# Patient Record
Sex: Female | Born: 1960 | Race: White | Hispanic: No | Smoking: Never smoker
Health system: Southern US, Community
[De-identification: ages and names within clinical notes are randomized; demographics above are authoritative.]

## PROBLEM LIST (undated history)

## (undated) DIAGNOSIS — I2699 Other pulmonary embolism without acute cor pulmonale: Secondary | ICD-10-CM

## (undated) HISTORY — PX: ABDOMINAL HYSTERECTOMY: SUR658

## (undated) HISTORY — PX: TONSILLECTOMY: SUR1361

## (undated) HISTORY — PX: CHOLECYSTECTOMY: SHX55

## (undated) HISTORY — PX: REPLACEMENT TOTAL KNEE BILATERAL: SUR1225

---

## 2005-02-10 ENCOUNTER — Ambulatory Visit: Payer: Self-pay | Admitting: Family Medicine

## 2005-02-22 ENCOUNTER — Ambulatory Visit: Payer: Self-pay | Admitting: Family Medicine

## 2005-02-24 ENCOUNTER — Ambulatory Visit: Payer: Self-pay | Admitting: Cardiology

## 2005-03-08 ENCOUNTER — Ambulatory Visit: Payer: Self-pay | Admitting: Family Medicine

## 2005-08-18 ENCOUNTER — Ambulatory Visit: Payer: Self-pay

## 2005-09-29 ENCOUNTER — Inpatient Hospital Stay: Payer: Self-pay

## 2006-07-18 ENCOUNTER — Ambulatory Visit: Payer: Self-pay | Admitting: Otolaryngology

## 2007-03-13 ENCOUNTER — Ambulatory Visit: Payer: Self-pay | Admitting: Orthopedic Surgery

## 2010-01-25 ENCOUNTER — Ambulatory Visit: Payer: Self-pay | Admitting: Unknown Physician Specialty

## 2010-03-17 ENCOUNTER — Ambulatory Visit: Payer: Self-pay | Admitting: Internal Medicine

## 2013-03-05 ENCOUNTER — Emergency Department: Payer: Self-pay | Admitting: Emergency Medicine

## 2013-03-05 LAB — CBC WITH DIFFERENTIAL/PLATELET
Basophil #: 0 10*3/uL (ref 0.0–0.1)
Eosinophil %: 0.4 %
HGB: 14.6 g/dL (ref 12.0–16.0)
MCH: 30.2 pg (ref 26.0–34.0)
MCHC: 33.4 g/dL (ref 32.0–36.0)
Monocyte #: 0.5 x10 3/mm (ref 0.2–0.9)
Monocyte %: 12.3 %
Neutrophil #: 2.1 10*3/uL (ref 1.4–6.5)
WBC: 4.3 10*3/uL (ref 3.6–11.0)

## 2013-03-05 LAB — COMPREHENSIVE METABOLIC PANEL
Albumin: 3.9 g/dL (ref 3.4–5.0)
Alkaline Phosphatase: 77 U/L (ref 50–136)
Chloride: 108 mmol/L — ABNORMAL HIGH (ref 98–107)
EGFR (Non-African Amer.): >60
Glucose: 90 mg/dL (ref 65–99)
Osmolality: 279 (ref 275–301)
Potassium: 3.6 mmol/L (ref 3.5–5.1)
Sodium: 140 mmol/L (ref 136–145)

## 2013-04-10 ENCOUNTER — Ambulatory Visit: Payer: Self-pay | Admitting: Internal Medicine

## 2013-08-05 ENCOUNTER — Ambulatory Visit: Payer: Self-pay | Admitting: Gastroenterology

## 2014-01-29 ENCOUNTER — Ambulatory Visit: Payer: Self-pay | Admitting: Unknown Physician Specialty

## 2014-02-13 ENCOUNTER — Ambulatory Visit: Payer: Self-pay | Admitting: Surgery

## 2014-02-17 LAB — PATHOLOGY REPORT

## 2014-03-05 ENCOUNTER — Ambulatory Visit: Payer: Self-pay | Admitting: Nurse Practitioner

## 2014-09-08 ENCOUNTER — Encounter: Payer: Self-pay | Admitting: Internal Medicine

## 2014-09-10 ENCOUNTER — Inpatient Hospital Stay: Payer: Self-pay | Admitting: Internal Medicine

## 2014-09-10 ENCOUNTER — Ambulatory Visit: Payer: Self-pay | Admitting: Internal Medicine

## 2014-09-10 LAB — CBC WITH DIFFERENTIAL/PLATELET
BASOS ABS: 0 10*3/uL (ref 0.0–0.1)
Basophil %: 0.6 %
Eosinophil #: 0.4 10*3/uL (ref 0.0–0.7)
Eosinophil %: 5 %
HCT: 38.7 % (ref 35.0–47.0)
HGB: 12.7 g/dL (ref 12.0–16.0)
LYMPHS ABS: 1.7 10*3/uL (ref 1.0–3.6)
LYMPHS PCT: 24 %
MCH: 30.3 pg (ref 26.0–34.0)
MCHC: 32.8 g/dL (ref 32.0–36.0)
MCV: 92 fL (ref 80–100)
MONOS PCT: 6.2 %
Monocyte #: 0.4 x10 3/mm (ref 0.2–0.9)
NEUTROS PCT: 64.2 %
Neutrophil #: 4.6 10*3/uL (ref 1.4–6.5)
PLATELETS: 267 10*3/uL (ref 150–440)
RBC: 4.19 10*6/uL (ref 3.80–5.20)
RDW: 13.3 % (ref 11.5–14.5)
WBC: 7.2 10*3/uL (ref 3.6–11.0)

## 2014-09-10 LAB — BASIC METABOLIC PANEL
Anion Gap: 10 (ref 7–16)
BUN: 14 mg/dL (ref 7–18)
CHLORIDE: 104 mmol/L (ref 98–107)
CO2: 26 mmol/L (ref 21–32)
CREATININE: 0.84 mg/dL (ref 0.60–1.30)
Calcium, Total: 8.5 mg/dL (ref 8.5–10.1)
GLUCOSE: 106 mg/dL — AB (ref 65–99)
OSMOLALITY: 280 (ref 275–301)
POTASSIUM: 3.8 mmol/L (ref 3.5–5.1)
SODIUM: 140 mmol/L (ref 136–145)

## 2014-09-10 LAB — TROPONIN I

## 2014-09-14 ENCOUNTER — Encounter: Payer: Self-pay | Admitting: Internal Medicine

## 2014-09-14 LAB — CBC WITH DIFFERENTIAL/PLATELET
Basophil #: 0.1 10*3/uL (ref 0.0–0.1)
Basophil %: 0.7 %
EOS PCT: 5.3 %
Eosinophil #: 0.5 10*3/uL (ref 0.0–0.7)
HCT: 41.7 % (ref 35.0–47.0)
HGB: 13.3 g/dL (ref 12.0–16.0)
LYMPHS ABS: 2.9 10*3/uL (ref 1.0–3.6)
Lymphocyte %: 32.1 %
MCH: 29.7 pg (ref 26.0–34.0)
MCHC: 31.9 g/dL — AB (ref 32.0–36.0)
MCV: 93 fL (ref 80–100)
MONOS PCT: 6.1 %
Monocyte #: 0.5 x10 3/mm (ref 0.2–0.9)
NEUTROS ABS: 5 10*3/uL (ref 1.4–6.5)
NEUTROS PCT: 55.8 %
PLATELETS: 322 10*3/uL (ref 150–440)
RBC: 4.48 10*6/uL (ref 3.80–5.20)
RDW: 13.5 % (ref 11.5–14.5)
WBC: 9 10*3/uL (ref 3.6–11.0)

## 2014-09-14 LAB — BASIC METABOLIC PANEL
ANION GAP: 7 (ref 7–16)
BUN: 19 mg/dL — ABNORMAL HIGH (ref 7–18)
CHLORIDE: 102 mmol/L (ref 98–107)
Calcium, Total: 8.9 mg/dL (ref 8.5–10.1)
Co2: 29 mmol/L (ref 21–32)
Creatinine: 0.98 mg/dL (ref 0.60–1.30)
EGFR (African American): >60
EGFR (Non-African Amer.): >60
GLUCOSE: 103 mg/dL — AB (ref 65–99)
OSMOLALITY: 278 (ref 275–301)
Potassium: 3.9 mmol/L (ref 3.5–5.1)
SODIUM: 138 mmol/L (ref 136–145)

## 2014-09-25 ENCOUNTER — Emergency Department: Payer: Self-pay | Admitting: Emergency Medicine

## 2014-09-25 LAB — CBC WITH DIFFERENTIAL/PLATELET
BASOS ABS: 0.1 10*3/uL (ref 0.0–0.1)
BASOS PCT: 0.8 %
Eosinophil #: 0.2 10*3/uL (ref 0.0–0.7)
Eosinophil %: 2.7 %
HCT: 42.2 % (ref 35.0–47.0)
HGB: 13.5 g/dL (ref 12.0–16.0)
Lymphocyte #: 2.6 10*3/uL (ref 1.0–3.6)
Lymphocyte %: 28.7 %
MCH: 29.3 pg (ref 26.0–34.0)
MCHC: 31.9 g/dL — AB (ref 32.0–36.0)
MCV: 92 fL (ref 80–100)
MONO ABS: 0.6 x10 3/mm (ref 0.2–0.9)
Monocyte %: 6.6 %
NEUTROS PCT: 61.2 %
Neutrophil #: 5.5 10*3/uL (ref 1.4–6.5)
PLATELETS: 375 10*3/uL (ref 150–440)
RBC: 4.6 10*6/uL (ref 3.80–5.20)
RDW: 13.2 % (ref 11.5–14.5)
WBC: 8.9 10*3/uL (ref 3.6–11.0)

## 2014-09-25 LAB — URINALYSIS, COMPLETE
Bilirubin,UR: NEGATIVE
Blood: NEGATIVE
Glucose,UR: NEGATIVE mg/dL (ref 0–75)
KETONE: NEGATIVE
Leukocyte Esterase: NEGATIVE
Nitrite: NEGATIVE
PROTEIN: NEGATIVE
Ph: 5 (ref 4.5–8.0)
RBC,UR: <1 /HPF (ref 0–5)
SPECIFIC GRAVITY: 1.006 (ref 1.003–1.030)
Squamous Epithelial: <1
WBC UR: <1 /HPF (ref 0–5)

## 2014-09-25 LAB — COMPREHENSIVE METABOLIC PANEL
ALT: 31 U/L
Albumin: 3.8 g/dL (ref 3.4–5.0)
Alkaline Phosphatase: 118 U/L — ABNORMAL HIGH
Anion Gap: 10 (ref 7–16)
BUN: 15 mg/dL (ref 7–18)
Bilirubin,Total: 0.5 mg/dL (ref 0.2–1.0)
CHLORIDE: 106 mmol/L (ref 98–107)
CO2: 26 mmol/L (ref 21–32)
Calcium, Total: 9.4 mg/dL (ref 8.5–10.1)
Creatinine: 1.11 mg/dL (ref 0.60–1.30)
EGFR (African American): >60
EGFR (Non-African Amer.): 54 — ABNORMAL LOW
Glucose: 90 mg/dL (ref 65–99)
Osmolality: 283 (ref 275–301)
Potassium: 4.1 mmol/L (ref 3.5–5.1)
SGOT(AST): 18 U/L (ref 15–37)
Sodium: 142 mmol/L (ref 136–145)
Total Protein: 7.7 g/dL (ref 6.4–8.2)

## 2014-10-10 DIAGNOSIS — I2699 Other pulmonary embolism without acute cor pulmonale: Secondary | ICD-10-CM

## 2014-10-10 HISTORY — DX: Other pulmonary embolism without acute cor pulmonale: I26.99

## 2014-10-14 ENCOUNTER — Encounter: Payer: Self-pay | Admitting: Internal Medicine

## 2014-11-14 ENCOUNTER — Encounter: Payer: Self-pay | Admitting: Internal Medicine

## 2015-03-07 NOTE — Discharge Summary (Signed)
PATIENT NAME:  Lacey Bradley, Lacey Bradley MR#:  [id number] DATE OF BIRTH:  01-Jun-1961  DATE OF ADMISSION:  09/10/2014 DATE OF DISCHARGE:    DISCHARGING PHYSICIAN: Sheikh A. Elijio Miles, MD.  ADMITTING PHYSICIAN: Srikar R. Sudini, MD.  DISCHARGE DIAGNOSES: 1. Acute bilateral pulmonary embolism.  2. Recent bilateral knee surgery.  3. Morbid obesity.  4. Chronic pain syndrome.   DIAGNOSTIC IMAGING: 1. CT scan of the chest revealed multiple bilateral pulmonary emboli, most prominent in the right middle and lower lobes.  2. Ultrasound of lower extremities. No evidence of deep venous thrombosis.  3. Chest x-ray on 09/13/2014: Mild cardiomegaly. No active lung disease.   HOSPITAL COURSE: This lady was admitted through the Emergency Room where she presented with shortness of breath, chest pain, lower extremity swelling following bilateral knee replacement a week previously. CT scan, angiography in the ED revealed bilateral pulmonary emboli. Please refer to the history and physical for details. The patient was admitted to a monitored bed. She underwent echocardiogram which did not reveal any evidence of RV strain. She was initially placed on intravenous heparin which was transitioned to oral Apixaban 10 mg twice a day. The patient continued to complain of chest pain without shortness of breath. However, a 12-lead EKG and chest x-ray were unremarkable. The patient remained on room air oxygen throughout her hospitalization. The patient'Lacey Bradley pain was managed with OxyContin and Dilaudid, and she is being discharged to a skilled nursing facility in satisfactory condition.   DISCHARGE MEDICATIONS: 1. Crestor 10 mg at bedtime.  2. Acetaminophen 325 mg 2 tabs every 4 hours as needed for pain. 3. Morphine 5000 mcg oral tablet daily. 4. Co-enzyme Q-10  at 200 mg 2 caps daily.  5. Desipramine 25 mg at bedtime.  6. Docusate sodium 100 mg b.i.d. 7. Esomeprazole 40 mg b.i.d.  8. Klor-Con 20 mEq 1/2 pack daily.  9. Milk of  Magnesia 8% oral suspension 30 mL daily p.r.n. for constipation. 10. Furosemide 30 mg daily.  11. Zonisamide 100 mg 2 capsules at bedtime. 12. OxyContin 20 mg every 12 hours. 13. Methocarbamol 500 mg every 6 hours p.r.n. for pain. 14. Dilaudid 2 mg 1 to 2 tabs every 3 hours p.r.n.  15. Esomeprazole 40 mg b.i.d.  16. Apixaban 5 mg 2 tabs daily until 11/5 then 5 mg b.i.d. daily for 6 months. 17. Aspirin 325 mg b.i.d. for 10 days only,   DIET: Low sodium.   ACTIVITY: As tolerated.  FOLLOWUP: In 2 to 4 weeks of discharge from skilled facility with Lacey Knudsen, NP.    DISCHARGE PROCESS TIME SPENT: 35 minutes.   ____________________________ Lacey Maxon Elijio Miles, MD sat:jh D: 09/15/2014 13:50:45 ET T: 09/15/2014 15:12:53 ET JOB#: [id number]  cc: Sheikh A. Elijio Miles, MD, <Dictator> Lacey Fells MD ELECTRONICALLY SIGNED 09/25/2014 10:01

## 2015-03-07 NOTE — Op Note (Signed)
PATIENT NAME:  Lacey Bradley, Lacey Bradley MR#:  [id number] DATE OF BIRTH:  Oct 28, 1961  DATE OF PROCEDURE:  02/13/2014  PREOPERATIVE DIAGNOSIS: Chronic cholecystitis.   POSTOPERATIVE DIAGNOSIS:  Chronic cholecystitis.  PROCEDURE: Laparoscopic cholecystectomy.   SURGEON: Rochel Brome, M.D.   ANESTHESIA: General.   INDICATIONS: This 54 year old female has history of severe right upper quadrant abdominal pain and borderline low ejection fraction of 39%, which, with injection of CCK, reproduced her right upper quadrant pain. Surgery was recommended for definitive treatment.   The patient was placed on the operating table in the supine position under general anesthesia. The abdomen was prepared with ChloraPrep, draped in a sterile manner.   A short incision was made in the inferior aspect of the umbilicus and carried down to the deep fascia, which was grasped with laryngeal hook and elevated. A Veress needle was inserted, aspirated and irrigated with a saline solution. Next, the peritoneal cavity was inflated with carbon dioxide. The Veress needle was removed. The 10 mm cannula was inserted. The 10 mm, 0 degree laparoscope was inserted to view the peritoneal cavity. The liver appeared to have some mild fatty infiltration. Another incision was made in the epigastrium slightly to the right of the midline to introduce an 11 mm cannula. Two incisions were made in the lateral aspect of the right upper quadrant to introduce two 5 mm cannulas.   There was some fatty infiltration of the liver noted. The patient was placed in the reverse Trendelenburg position and turned several degrees to the left. The gallbladder was retracted towards the right shoulder. Multiple adhesions were taken down with blunt and sharp dissection. The neck of the gallbladder was then retracted anteriorly and laterally. The cystic duct was dissected free from surrounding structures. The gallbladder neck was mobilized with incision of the visceral  peritoneum. The cystic artery was dissected free from surrounding structures. A critical view of safety was demonstrated. An endoclip was placed across the cystic duct. An incision was made in the cystic duct. The duct was found to be very small and Reddick catheter would not thread in due to its small size; therefore, a cholangiogram was not done. The Reddick catheter was removed. The cystic duct was doubly ligated with endoclips and divided. The cystic artery was controlled with double endoclips and divided. The gallbladder was dissected free from the liver with hook and cautery. Bleeding was minimal. Hemostasis subsequently intact. The gallbladder was delivered up through the infraumbilical incision, opened and suctioned, removed and submitted for routine pathology. The right upper quadrant was further inspected. Hemostasis was intact. The cannulas were removed allowing carbon dioxide to escape from the peritoneal cavity. The skin incisions were closed with interrupted 5-0 chromic subcuticular suture, benzoin  and Steri-Strips. Dressings were applied with paper tape. The patient tolerated the surgery satisfactorily. He was prepared for transfer to the recovery room.     ____________________________ Lenna Sciara. Rochel Brome, MD jws:dmm D: 02/13/2014 14:59:35 ET T: 02/13/2014 19:06:44 ET JOB#: [id number]  cc: Loreli Dollar, MD, <Dictator> Loreli Dollar MD ELECTRONICALLY SIGNED 02/25/2014 8:48

## 2015-03-07 NOTE — H&P (Signed)
PATIENT NAME:  Lacey Bradley, Lacey Bradley MR#:  [id number] DATE OF BIRTH:  02/19/1961  DATE OF ADMISSION:  09/10/2014  PRIMARY CARE PROVIDER: Chelsa B. Matthew Saras, NP and Perrin Maltese, MD.    CHIEF COMPLAINT: Shortness of breath, as well as chest pain, lower extremity swelling.   HISTORY OF PRESENT ILLNESS: A 54 year old Caucasian female patient with morbid obesity, arthritis, migraines, recently had bilateral knee replacements and presently in a nursing home, had a CT scan of the chest done for chest pain and shortness of breath. The patient has been found to have bilateral pulmonary emboli with RV strain on the CT, and is being admitted to the hospitalist service. The patient does have shortness of breath, but her saturations are normal. Heart rate and blood pressure are in the normal range. She did receive a dose of Lovenox yesterday night therapeutic 130 mg for concern for PE.   The patient does not have any history of PE or DVT. She mentions that she has been on SCDs, also aspirin 325 mg 2 times a day has been started for DVT prophylaxis by her orthopedic surgeon.   PAST MEDICAL HISTORY: 1.  Obesity.  2.  Migraines.  3.  Asthma.  4.  Arthritis.  5.  Hysterectomy.  6.  Bilateral total knee replacement.   ALLERGIES: MORPHINE, TOPAMAX TOPICAL CORTICAL STEROIDS.   FAMILY HISTORY: Both ovarian and uterine cancer. No history of DVT, PE.   HOME MEDICATIONS: 1.  Acetaminophen 325, two tablets every 4 hours as needed for pain.  2.  Aspirin 325 mg oral 2 times a day.  3.  Biotin 5000 mcg oral daily.  4.  Coenzyme Q10 at 200 mg 2 capsules daily.  5.  Crestor 10 mg daily.  6.  Desipramine 25 mg 2 tablets daily.  7.  Docusate sodium 100 mg oral 2 times a day as needed for constipation.  8.   Protonix 40 mg oral 2 times a day.  9.  Hydromorphone 2 mg 1 to 3 tablets every 3 hours as needed for pain.  10.   Potassium chloride 20 mEq  oral once a day.  11.   Methocarbamol 500 mg oral every 6 hours as  needed for pain.  12.   Milk of magnesia 8% oral, 30 mL once a day as needed for constipation.  13.   OxyContin 20 mg oral every 12 hours.  14.   Torsemide 10 mg oral once a day.  15.   Zonisamide 100 mg 2 capsules oral once a day.   REVIEW OF SYSTEMS: CONSTITUTIONAL: Complains of fatigue, weakness, weight gain.  ENT: No blurred vision, pain or redness.  HEENT: No tinnitus, ear pain, hearing loss.  RESPIRATORY: Has shortness of breath, asthma.  CARDIOVASCULAR: Has right-sided chest pain, orthopnea, and lower extremity edema.  GASTROINTESTINAL: No nausea, vomiting, diarrhea, abdominal pain.  GENITOURINARY: No dysuria, hematuria, frequency.  ENDOCRINE: No polyuria, nocturia, thyroid problems.  HEMATOLOGIC AND LYMPHATIC: No anemia, easy bruising, bleeding.  INTEGUMENTARY: No acne, rash, lesion.  MUSCULOSKELETAL: Has bilateral knee pain after surgery.  NEUROLOGIC: No focal numbness, weakness, seizure.  PSYCHIATRIC: No anxiety or depression.   PHYSICAL EXAMINATION: VITAL SIGNS: Temperature 98.2, pulse of 80, blood pressure 157/69, saturating 99% on room air.  GENERAL: A morbidly obese Caucasian female patient lying in bed, overall seems comfortable, conversational, cooperative with exam.  PSYCHIATRIC: Alert and oriented x 3. Mood and affect appropriate. Judgment intact.  HEENT: Atraumatic, normocephalic. Oral mucosa moist and pink. External ears and  nose normal. No pallor. No icterus. Pupils bilaterally equal and reactive to light.  NECK: Supple. No thyromegaly. No palpable lymph nodes. Trachea midline. No carotid bruit, JVD.  CARDIOVASCULAR: S1, S2, without any murmurs. Peripheral pulses 2+. No edema.  RESPIRATORY: Normal work of breathing. Clear to auscultation on both sides.  GASTROINTESTINAL: Soft abdomen, nontender. Bowel sounds present. No organomegaly palpable.  SKIN: Warm and dry. No petechiae, rash, ulcers.  MUSCULOSKELETAL: Has scars from recent bilateral knee surgeries, along  with swelling. No discharge. They are mildly warm, but no redness. No other joint swelling or redness noticed. Has some edema in bilateral lower extremities.  NEUROLOGICAL: Motor strength 5/5 in upper and lower extremities. Sensation is intact all over.  LYMPHATIC: No cervical lymphadenopathy.   LABORATORY STUDIES: Glucose 106, BUN 14, creatinine 0.84. Sodium 140, potassium 3.8. Troponin less than 0.02.    hemoglobin 12.7, platelets of 267,000.   EKG shows normal sinus rhythm.   A CTA of the chest shows multiple bilateral pulmonary emboli with evidence of right heart strain with abnormal RV LV ratio. No infiltrate or pulmonary edema.   ASSESSMENT AND PLAN: 1.  Bilateral pulmonary emboli with right ventricular strain on the CT scan. Presently, the patient's vitals are stable. No tachycardia. Normal blood pressure. Her saturations are 98% on room air. I do not think she needs TPA at this point. Will admit patient due to her right ventricular strain on CT scan. Start her on Lovenox b.i.d. Get lower extremity Dopplers, check an echocardiogram. Hopefully, patient can be discharged on oral anticoagulation when better. This is a provoked DVT and first episode.  2.  Elevated blood pressure, likely secondary from anxiety and pain. If consistently elevated, will need blood pressure medications.  3.  Recent bilateral total knee arthroplasty. Continue PT. The patient will return to skilled nursing facility at discharge.  4.  History of migraines. Continue medications.   CODE STATUS: Full code.   TIME SPENT ON THIS CASE: 50 minutes.      ____________________________ Leia Alf Alyene Predmore, MD srs:at D: 09/10/2014 19:08:35 ET T: 09/10/2014 19:29:23 ET JOB#: [id number]  cc: Alveta Heimlich R. Darvin Neighbours, MD, <Dictator> Perrin Maltese, MD [place] Matthew Saras, NP Wagner, Gilliam MD ELECTRONICALLY SIGNED 09/18/2014 14:50

## 2016-10-31 ENCOUNTER — Ambulatory Visit (INDEPENDENT_AMBULATORY_CARE_PROVIDER_SITE_OTHER): Payer: BLUE CROSS/BLUE SHIELD

## 2016-10-31 ENCOUNTER — Ambulatory Visit
Admit: 2016-10-31 | Discharge: 2016-10-31 | Disposition: A | Discharge status: Home / Self Care | Payer: BLUE CROSS/BLUE SHIELD | Attending: Family Medicine | Admitting: Family Medicine

## 2016-10-31 ENCOUNTER — Ambulatory Visit
Admission: EM | Admit: 2016-10-31 | Discharge: 2016-10-31 | Disposition: A | Discharge status: Home / Self Care | Payer: BLUE CROSS/BLUE SHIELD | Attending: Family Medicine | Admitting: Family Medicine

## 2016-10-31 DIAGNOSIS — Z86711 Personal history of pulmonary embolism: Secondary | ICD-10-CM | POA: Insufficient documentation

## 2016-10-31 DIAGNOSIS — Z9889 Other specified postprocedural states: Secondary | ICD-10-CM | POA: Insufficient documentation

## 2016-10-31 DIAGNOSIS — M25551 Pain in right hip: Secondary | ICD-10-CM | POA: Diagnosis not present

## 2016-10-31 DIAGNOSIS — Z90710 Acquired absence of both cervix and uterus: Secondary | ICD-10-CM | POA: Insufficient documentation

## 2016-10-31 DIAGNOSIS — M545 Low back pain: Secondary | ICD-10-CM | POA: Insufficient documentation

## 2016-10-31 DIAGNOSIS — Z9049 Acquired absence of other specified parts of digestive tract: Secondary | ICD-10-CM | POA: Diagnosis not present

## 2016-10-31 DIAGNOSIS — Z96653 Presence of artificial knee joint, bilateral: Secondary | ICD-10-CM | POA: Diagnosis not present

## 2016-10-31 HISTORY — DX: Other pulmonary embolism without acute cor pulmonale: I26.99

## 2016-10-31 LAB — URINALYSIS, COMPLETE (UACMP) WITH MICROSCOPIC
BACTERIA UA: NONE SEEN
Bilirubin Urine: NEGATIVE
Glucose, UA: NEGATIVE mg/dL
HGB URINE DIPSTICK: NEGATIVE
Ketones, ur: NEGATIVE mg/dL
Leukocytes, UA: NEGATIVE
Nitrite: NEGATIVE
Protein, ur: NEGATIVE mg/dL
RBC / HPF: NONE SEEN RBC/hpf (ref 0–5)
SPECIFIC GRAVITY, URINE: 1.02 (ref 1.005–1.030)
pH: 6 (ref 5.0–8.0)

## 2016-10-31 NOTE — ED Triage Notes (Signed)
Patient complains of right hip pain. Patient states that she was recently taken off her blood thinner (xarelto) since she is having foot surgery tomorrow. Patient states that she has had no known injury to her hip and she states that the pain was so bad last night that it kept her up. Patient states that she is worried that this could be a blood clot causing this.

## 2016-10-31 NOTE — ED Provider Notes (Signed)
[facility] ____________________________________________  Time seen: Approximately 1500 PM  I have reviewed the triage vital signs and the nursing notes.   HISTORY  Chief Complaint Hip Pain (Right)  HPI Lacey Bradley is a 55 y.o. female with a history of bilateral pulmonary embolism post bilateral knee replacement in 2015, presenting for the complaint of right hip pain. Patient reports right hip pain started last night. Denies fall, injury, trauma or known trigger. Patient reports that she has generalized inflammatory pain and currently awaited to be evaluated by a rheumatologist. Patient states that she frequently has joint pain. Patient denies history of hip pain. States pain is directly to her right hip, but reports as she's been sitting in the urgent care waiting room she started having pain in her right lower back. Reports has continued to remain ambulatory. States pain is moderate. States pain is worse with movement. Improves with rest. States some pain to right posterior thigh.   Denies any lower extremity swelling, ecchymosis, paresthesias, recent sickness, recent travel or recent sedentary lifestyle. Patient expresses concern of a blood clot to her right leg. Patient states she is concerned of blood clots as she stopped her Xarelto 2 days ago in preparation to right foot surgery. States scheduled for right foot surgery tomorrow. Patient states that she came in today because she wanted to make sure she did not have a blood clot in her right leg. Patient reports feeling well otherwise.  Denies chest pain or shortness of breath, abdominal pain, dysuria, extremity swelling, rash, paresthesias, fever, recent sickness. Patient reports that she had been off of Xarelto for approximately 1 year, then restarted 5 months ago because her doctor said her d-dimer was starting to elevate and then improved on the Xarelto.  No LMP recorded. Patient has had a hysterectomy.    Past  Medical History:  Diagnosis Date  . Bilateral pulmonary embolism ([place]) 10/10/2014    There are no active problems to display for this patient.   Past Surgical History:  Procedure Laterality Date  . ABDOMINAL HYSTERECTOMY    . CHOLECYSTECTOMY    . REPLACEMENT TOTAL KNEE BILATERAL    . TONSILLECTOMY      Current Outpatient Rx  . Order #: [id number] Class: Historical Med  . Order #: [id number] Class: Historical Med  . Order #: [id number] Class: Historical Med  . Order #: [id number] Class: Historical Med  . Order #: [id number] Class: Historical Med    No current facility-administered medications for this encounter.   Current Outpatient Prescriptions:  .  esomeprazole (NEXIUM) 40 MG capsule, Take 40 mg by mouth daily at 12 noon., Disp: , Rfl:  .  nortriptyline (PAMELOR) 25 MG capsule, Take 25 mg by mouth at bedtime., Disp: , Rfl:  .  rivaroxaban (XARELTO) 10 MG TABS tablet, Take 10 mg by mouth daily., Disp: , Rfl:  .  rosuvastatin (CRESTOR) 10 MG tablet, Take 10 mg by mouth daily., Disp: , Rfl:  .  zonisamide (ZONEGRAN) 100 MG capsule, Take 100 mg by mouth daily., Disp: , Rfl:   Allergies Patient has no known allergies.  History reviewed. No pertinent family history.  Social History Social History  Substance Use Topics  . Smoking status: Never Smoker  . Smokeless tobacco: Never Used  . Alcohol use No    Review of Systems Constitutional: No fever/chills Eyes: No visual changes. ENT: No sore throat. Cardiovascular: Denies chest pain. Respiratory: Denies shortness of breath. Gastrointestinal: No abdominal pain.  No nausea, no vomiting.  No diarrhea.  No  constipation. Genitourinary: Negative for dysuria. Musculoskeletal: Negative for back pain.As above. Skin: Negative for rash. Neurological: Negative for headaches, focal weakness or numbness.  10-point ROS otherwise negative.  ____________________________________________   PHYSICAL EXAM:  VITAL SIGNS: ED Triage Vitals    Enc Vitals Group     BP 10/31/16 1243 (!) 149/75     Pulse Rate 10/31/16 1243 73     Resp 10/31/16 1243 17     Temp 10/31/16 1243 97.4 F (36.3 C)     Temp Source 10/31/16 1243 Oral     SpO2 10/31/16 1243 99 %     Weight 10/31/16 1235 297 lb (134.7 kg)     Height 10/31/16 1235 5\' 8"  (1.727 m)     Head Circumference --      Peak Flow --      Pain Score 10/31/16 1243 8     Pain Loc --      Pain Edu? --      Excl. in [place]? --     Constitutional: Alert and oriented. Well appearing and in no acute distress. Eyes: Conjunctivae are normal. PERRL. EOMI. ENT      Head: Normocephalic and atraumatic.      Nose: No congestion/rhinnorhea.      Mouth/Throat: Mucous membranes are moist. Cardiovascular: Normal rate, regular rhythm. Grossly normal heart sounds.  Good peripheral circulation. Respiratory: Normal respiratory effort without tachypnea nor retractions. Breath sounds are clear and equal bilaterally. No wheezes/rales/rhonchi.. Gastrointestinal: Soft and nontender.  Musculoskeletal: Ambulatory with mild antalgic gait. No midline cervical, thoracic or lumbar tenderness to palpation. Bilateral pedal pulses equal and easily palpated.      Right lower leg:  No tenderness or edema. except : Mild pain to right lateral hip and right piriformis muscular area, pain increases with right hip abduction, no pain with hip flexion, full range of motion present. Mild diffuse right posterior thigh tenderness to palpation, no swelling, no ecchymosis. Right lower extremity otherwise nontender. Negative Homan's sign. No midline tenderness.       Left lower leg:  No tenderness or edema.  Neurologic:  Normal speech and language.  Speech is normal. No gait instability.  Skin:  Skin is warm, dry and intact. No rash noted. Psychiatric: Mood and affect are normal. Speech and behavior are normal. Patient exhibits appropriate insight and judgment   ___________________________________________   LABS (all labs ordered  are listed, but only abnormal results are displayed)  Labs Reviewed  URINALYSIS, COMPLETE (UACMP) WITH MICROSCOPIC - Abnormal; Notable for the following:       Result Value   Squamous Epithelial / LPF 6-30 (*)    All other components within normal limits    RADIOLOGY  US Venous Img Lower Unilateral Right  Result Date: 10/31/2016 CLINICAL DATA:  Right hip and right leg pain for 1 day. EXAM: RIGHT LOWER EXTREMITY VENOUS DOPPLER ULTRASOUND TECHNIQUE: Gray-scale sonography with graded compression, as well as color Doppler and duplex ultrasound, were performed to evaluate the deep venous system from the level of the common femoral vein through the popliteal and proximal calf veins. Spectral Doppler was utilized to evaluate flow at rest and with distal augmentation maneuvers. COMPARISON:  None. FINDINGS: Left common femoral vein is patent without thrombus. Normal compressibility, augmentation and color Doppler flow in the right common femoral vein, right femoral vein and right popliteal vein. The right saphenofemoral junction is patent. Right profunda femoral vein is patent without thrombus. Visualized right deep calf veins are patent without thrombus. IMPRESSION: Negative  for deep venous thrombosis in right lower extremity. Electronically Signed   By: Markus Daft M.D.   On: 10/31/2016 16:20   Dg Hip Unilat With Pelvis 2-3 Views Right  Result Date: 10/31/2016 CLINICAL DATA:  55 year old female with pain in the right hip since yesterday evening. No history of trauma. EXAM: DG HIP (WITH OR WITHOUT PELVIS) 2-3V RIGHT COMPARISON:  None. FINDINGS: AP view of the pelvis demonstrates no acute displaced pelvic bone fractures. Bilateral proximal femurs as visualized appear intact and the right femoral head is located. Mild joint space narrowing, subchondral sclerosis and osteophyte formation is identified in both hip joints, compatible with mild osteoarthritis. Degenerative changes of osteoarthritis are also  noted at the symphysis pubis. IMPRESSION: 1. No acute abnormality of the bony pelvis or the right hip. 2. Mild bilateral hip joint osteoarthritis. Electronically Signed   By: Vinnie Langton M.D.   On: 10/31/2016 15:35   ____________________________________________   PROCEDURES Procedures   INITIAL IMPRESSION / ASSESSMENT AND PLAN / ED COURSE  Pertinent labs & imaging results that were available during my care of the patient were reviewed by me and considered in my medical decision making (see chart for details).  Well-appearing patient. No acute distress. Presents with complaints of right hip pain. Denies fall or trauma. Patient expresses concern of DVT has history of similar. Patient reports however it DVT in past was postsurgical. Patient denies recent surgery, but reports is scheduled for foot surgery tomorrow. Patient denies any other symptoms. Patient with right hip tenderness and piriformis muscular tenderness, suspect muscular and inflammatory pain. However as patient with history of DVT and recently off of her Xarelto for preop, will evaluate ultrasound. Also evaluate x-ray to exclude bony abnormality.  Right hip x-ray per radiologist no acute abnormality of the bony pelvis or the right hip, mild bilateral joint osteoarthritis. Per radiologist right lower extremity venous ultrasound negative for deep venous thrombosis. Discussed these results in detail with patient. Encouraged supportive care, rest, ice, elevation and follow-up closely with her primary care physician or orthopedist. Patient denies need for prescription pain medication. Discussed strict follow-up and return parameters.  Discussed follow up with Primary care physician this week. Discussed follow up and return parameters including no resolution or any worsening concerns. Patient verbalized understanding and agreed to plan.   ____________________________________________   FINAL CLINICAL IMPRESSION(S) / ED  DIAGNOSES  Final diagnoses:  Acute pain of right hip  Right hip pain     Discharge Medication List as of 10/31/2016  4:00 PM      Note: This dictation was prepared with Dragon dictation along with smaller phrase technology. Any transcriptional errors that result from this process are unintentional.    Clinical Course       Marylene Land, NP 10/31/16 2009

## 2016-10-31 NOTE — Discharge Instructions (Signed)
Rest. Ice.   Follow up closely with your primary care or orthopedic.   Return to Urgent care for new or worsening concerns.

## 2016-11-01 ENCOUNTER — Ambulatory Visit: Payer: BLUE CROSS/BLUE SHIELD

## 2017-01-06 IMAGING — CR DG HIP (WITH OR WITHOUT PELVIS) 2-3V*R*
3 series · 3 of 3 positions shown · non-contrast
Comparison: None.

CLINICAL DATA: 55-year-old female with pain in the right hip since
yesterday evening. No history of trauma.

EXAM:
DG HIP (WITH OR WITHOUT PELVIS) 2-3V RIGHT

[pelvis ap]
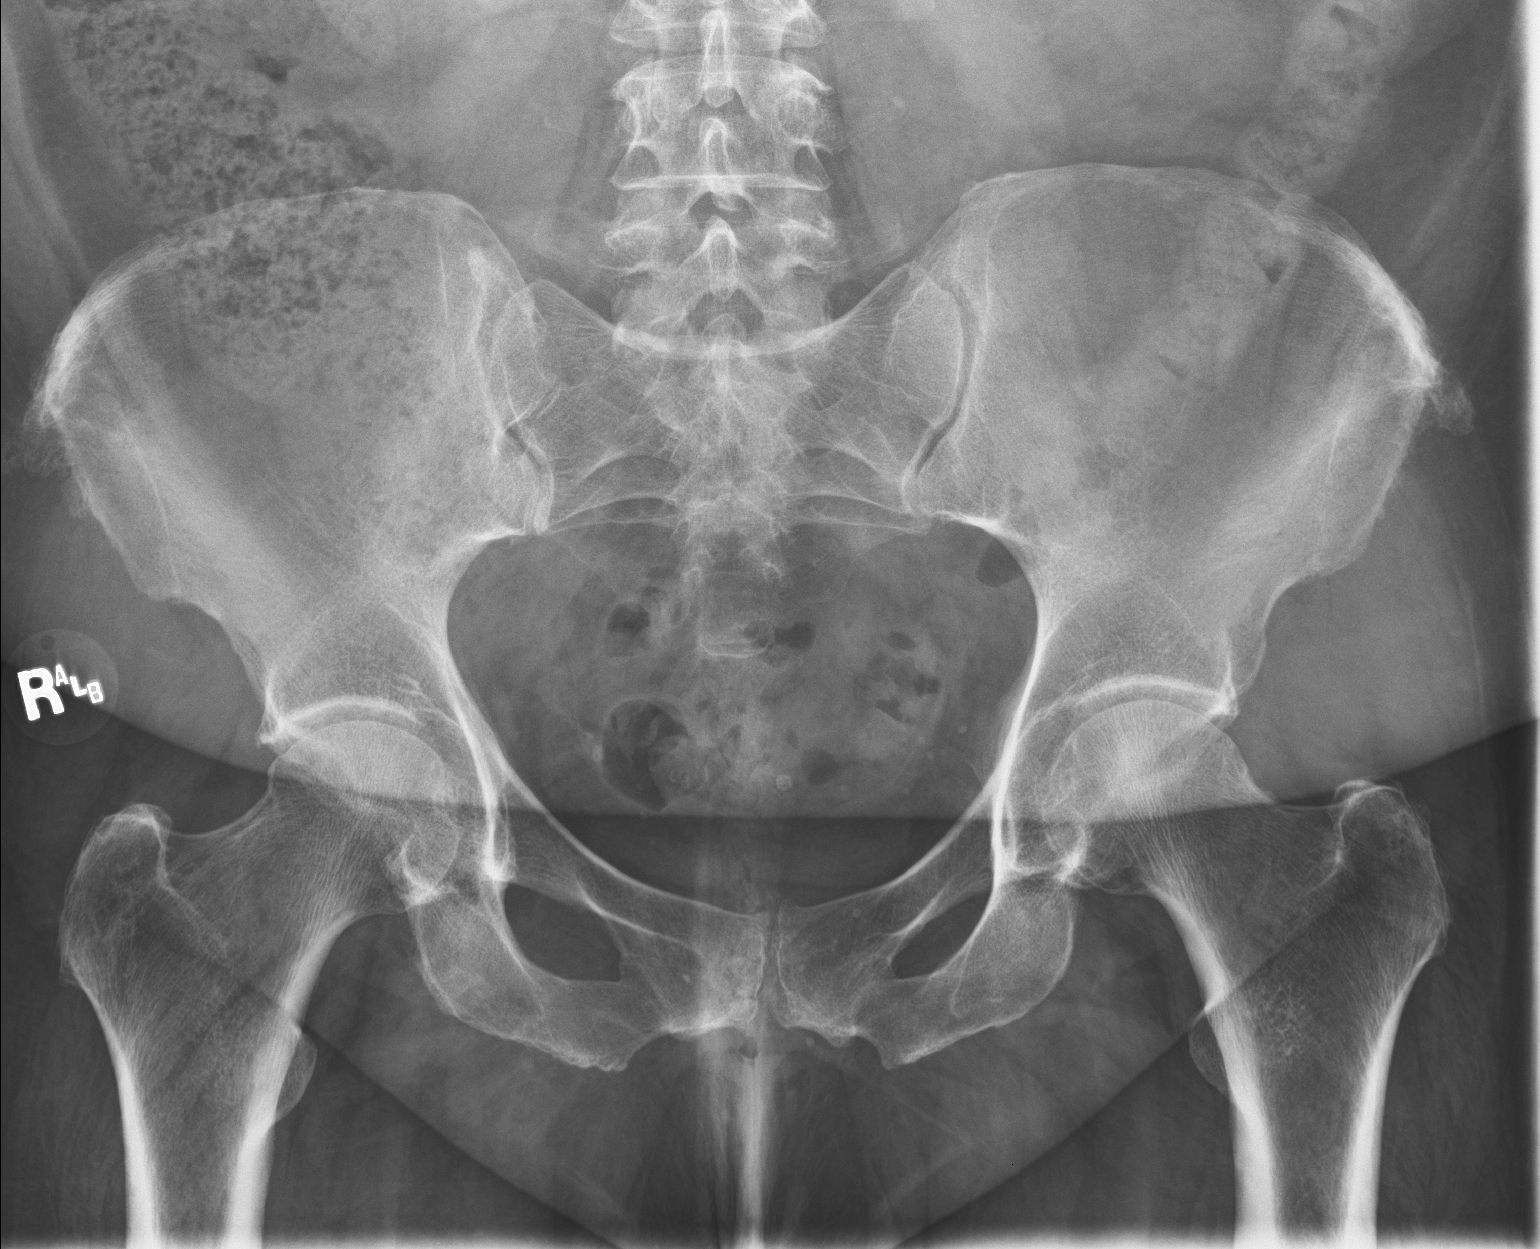

[hip ap]
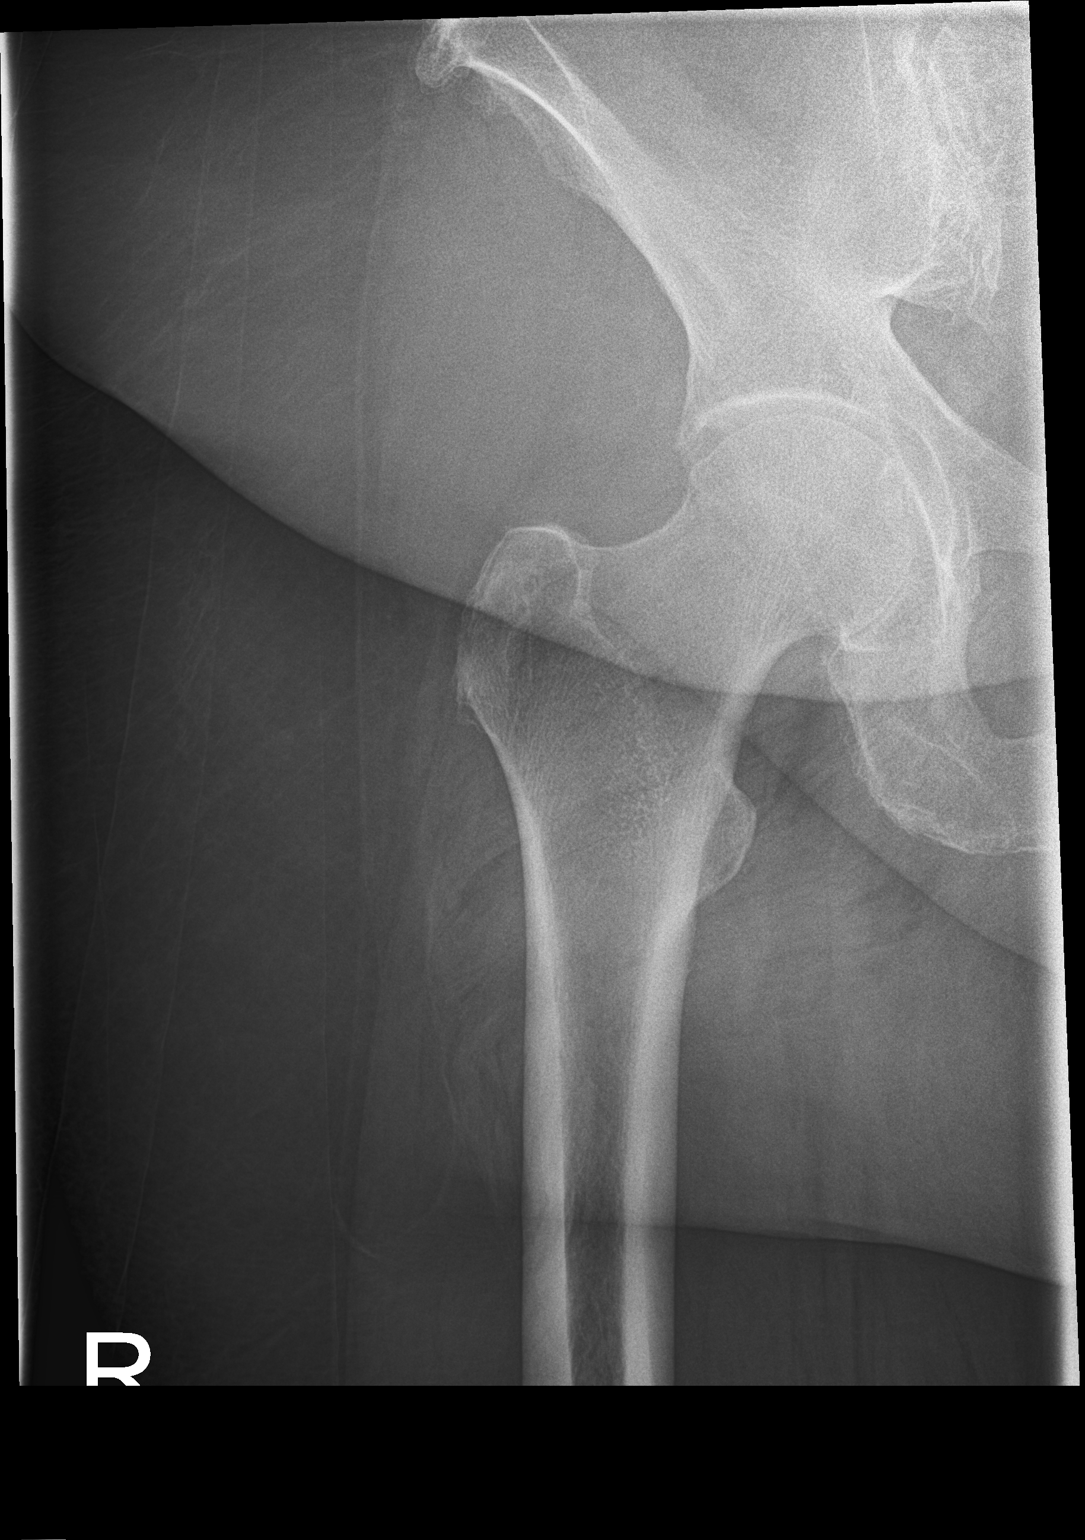

[hip lat]
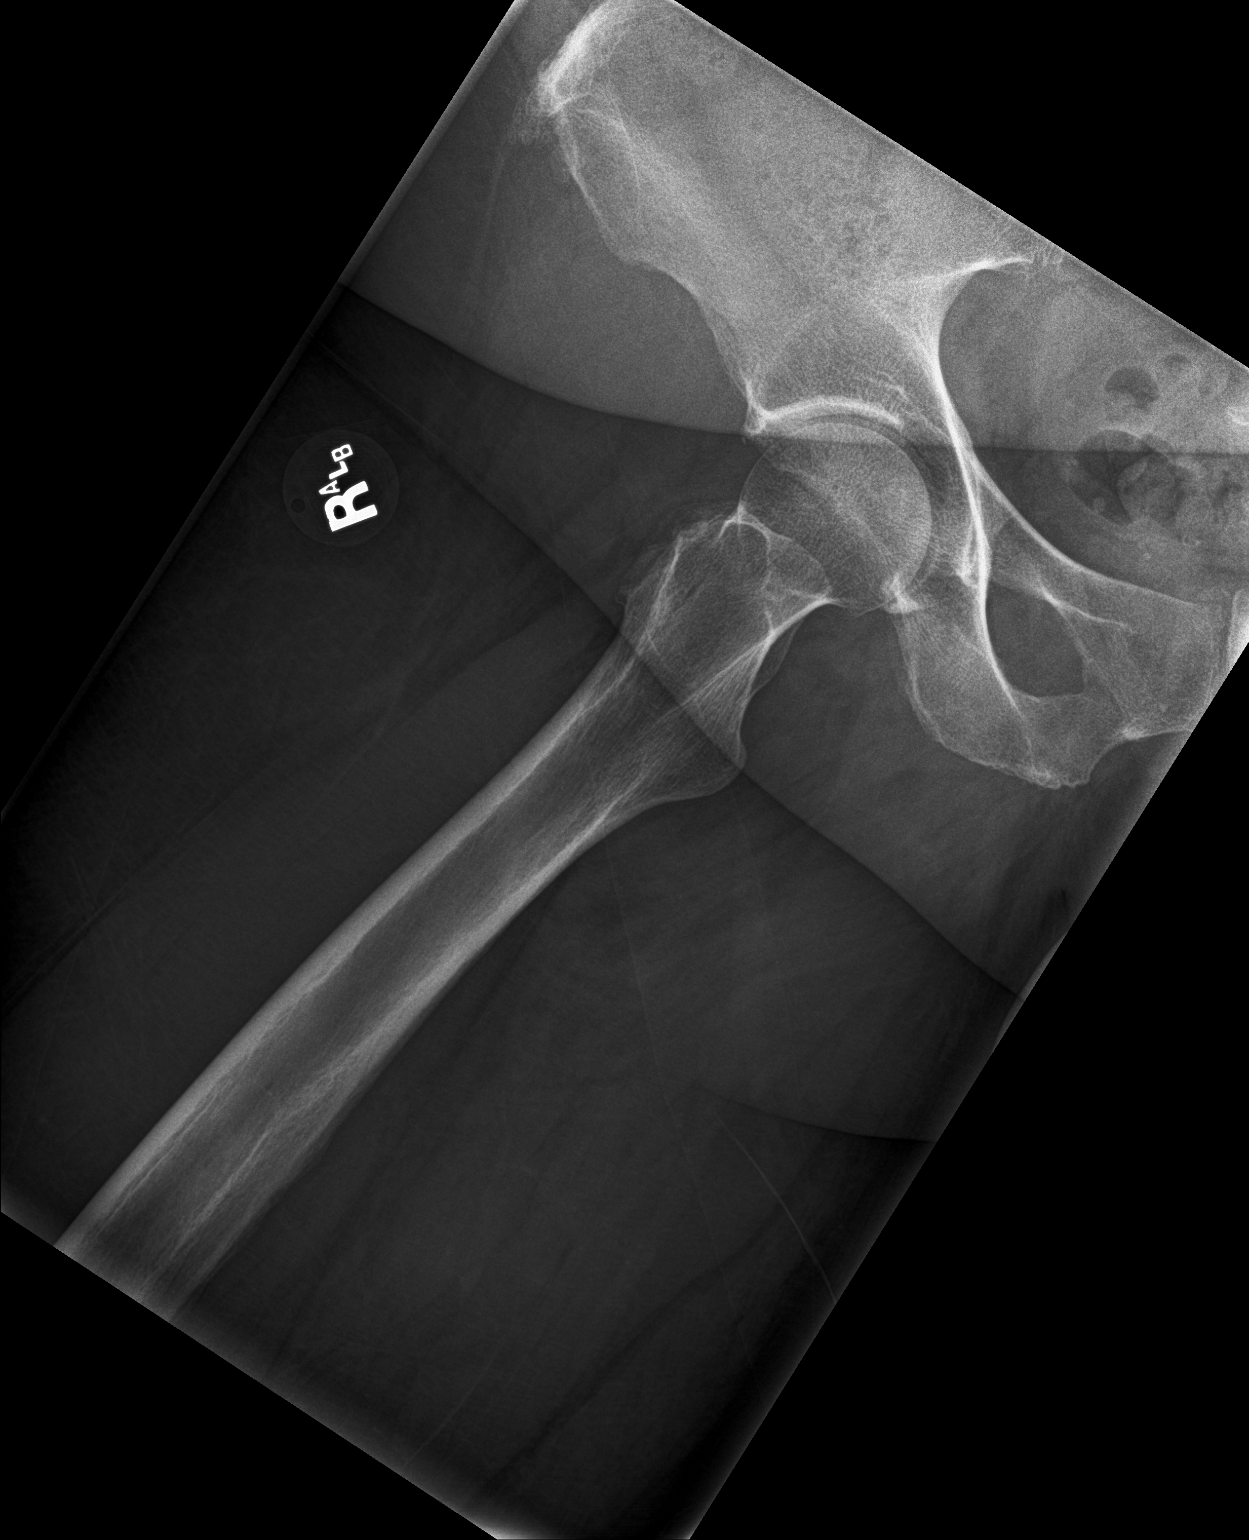

[3 of 3 positions shown; findings below may reference images not displayed]

FINDINGS: AP view of the pelvis demonstrates no acute displaced pelvic bone
fractures. Bilateral proximal femurs as visualized appear intact and
the right femoral head is located. Mild joint space narrowing,
subchondral sclerosis and osteophyte formation is identified in both
hip joints, compatible with mild osteoarthritis. Degenerative
changes of osteoarthritis are also noted at the symphysis pubis.
IMPRESSION: 1. No acute abnormality of the bony pelvis or the right hip.
2. Mild bilateral hip joint osteoarthritis.

## 2017-07-27 ENCOUNTER — Other Ambulatory Visit: Payer: Self-pay | Admitting: Nurse Practitioner

## 2017-07-27 DIAGNOSIS — Z1231 Encounter for screening mammogram for malignant neoplasm of breast: Secondary | ICD-10-CM

## 2017-10-06 ENCOUNTER — Other Ambulatory Visit: Payer: Self-pay | Admitting: Nurse Practitioner

## 2017-10-06 ENCOUNTER — Ambulatory Visit
Admission: RE | Admit: 2017-10-06 | Discharge: 2017-10-06 | Disposition: A | Discharge status: Home / Self Care | Payer: BLUE CROSS/BLUE SHIELD | Source: Ambulatory Visit | Attending: Nurse Practitioner | Admitting: Nurse Practitioner

## 2017-10-06 DIAGNOSIS — I517 Cardiomegaly: Secondary | ICD-10-CM | POA: Insufficient documentation

## 2017-10-06 DIAGNOSIS — R0600 Dyspnea, unspecified: Secondary | ICD-10-CM

## 2017-12-12 IMAGING — CR DG CHEST 2V
1 series · 2 of 2 positions shown · non-contrast
Comparison: Chest x-ray dated 09/13/2014.

CLINICAL DATA: Tenderness over right clavicle, some dyspnea.
History of bilateral pulmonary emboli in 6841.

EXAM:
CHEST  2 VIEW

[Series 1: dg chest 2 view · 0.14mm/px · 2 of 2 slices shown]
[im 1/2]
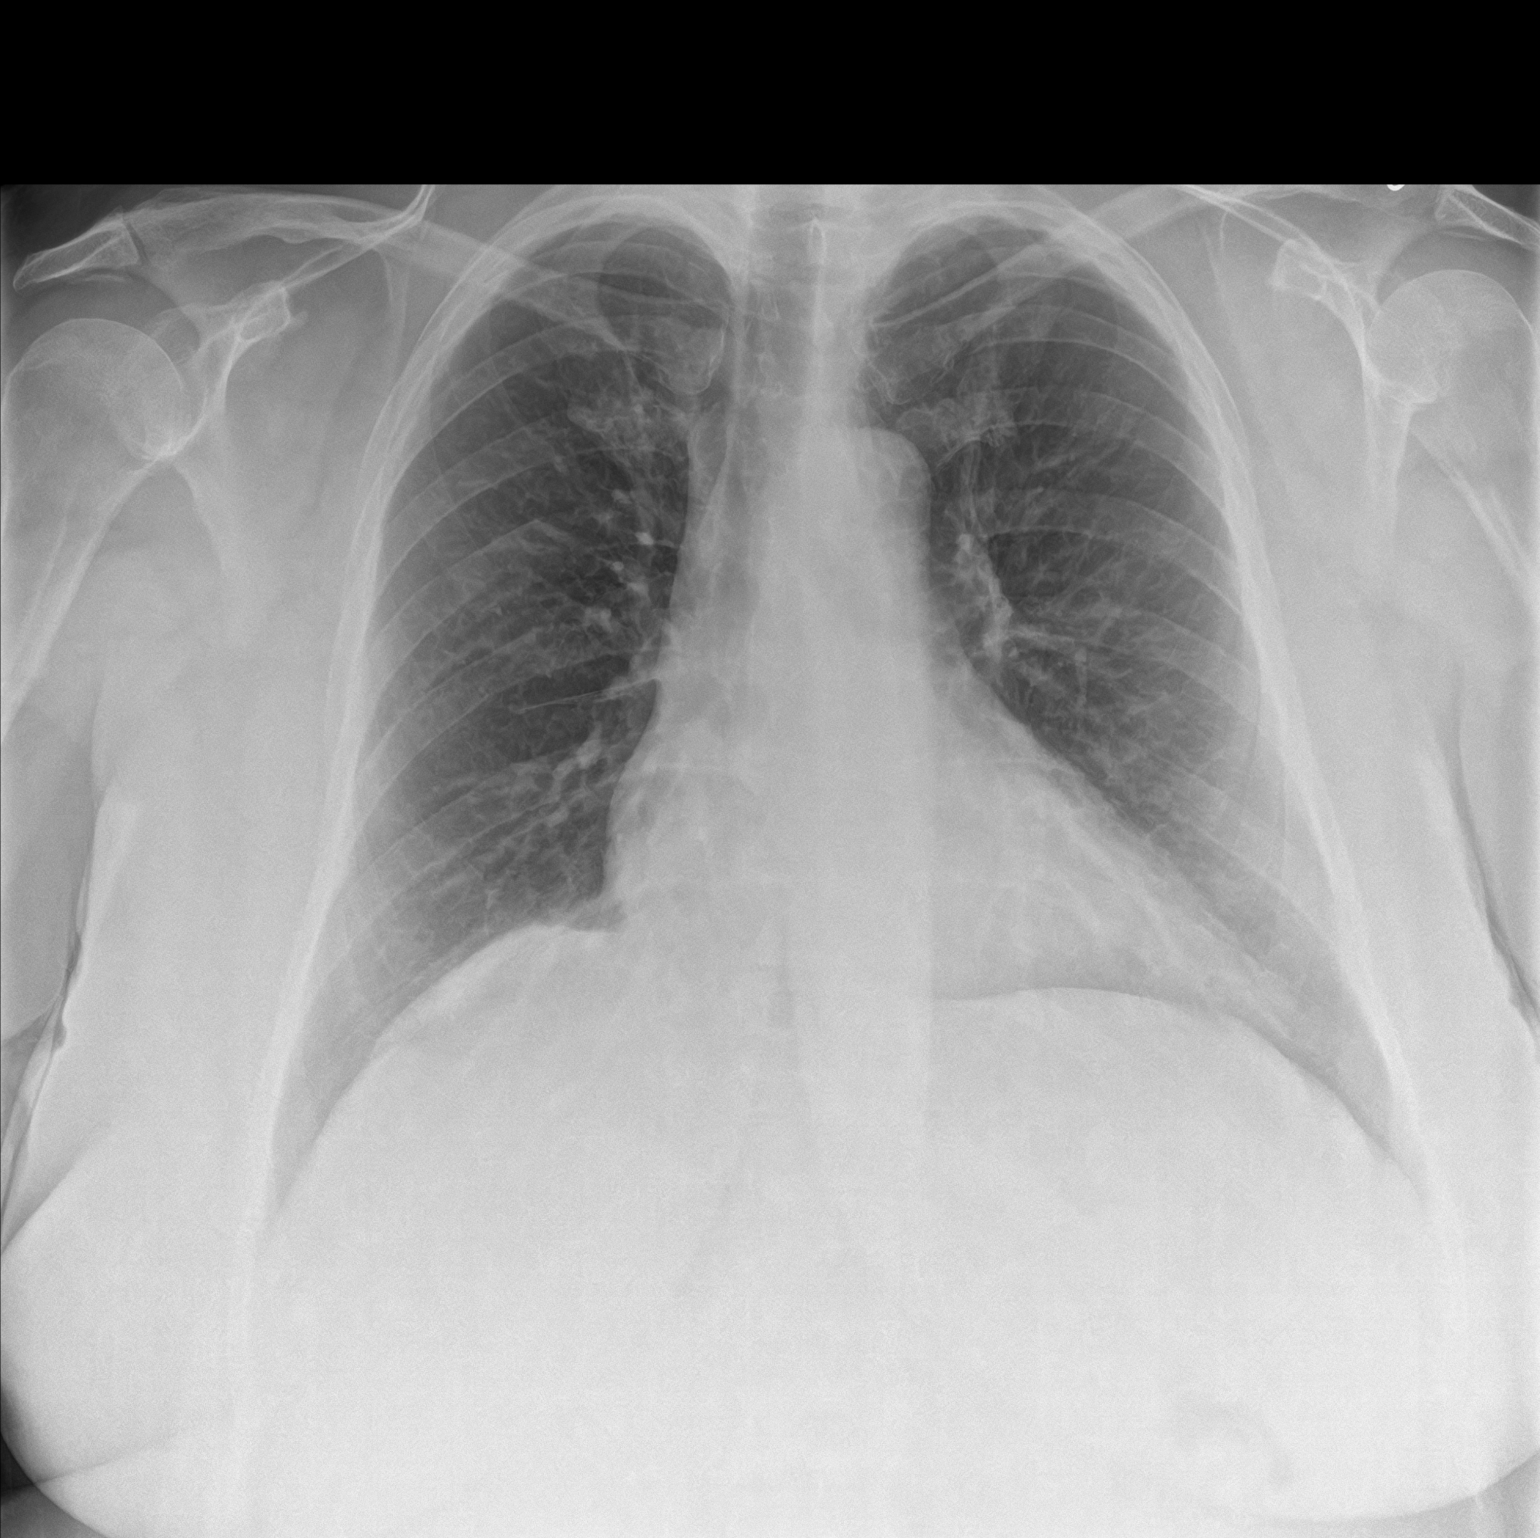
[im 2/2]
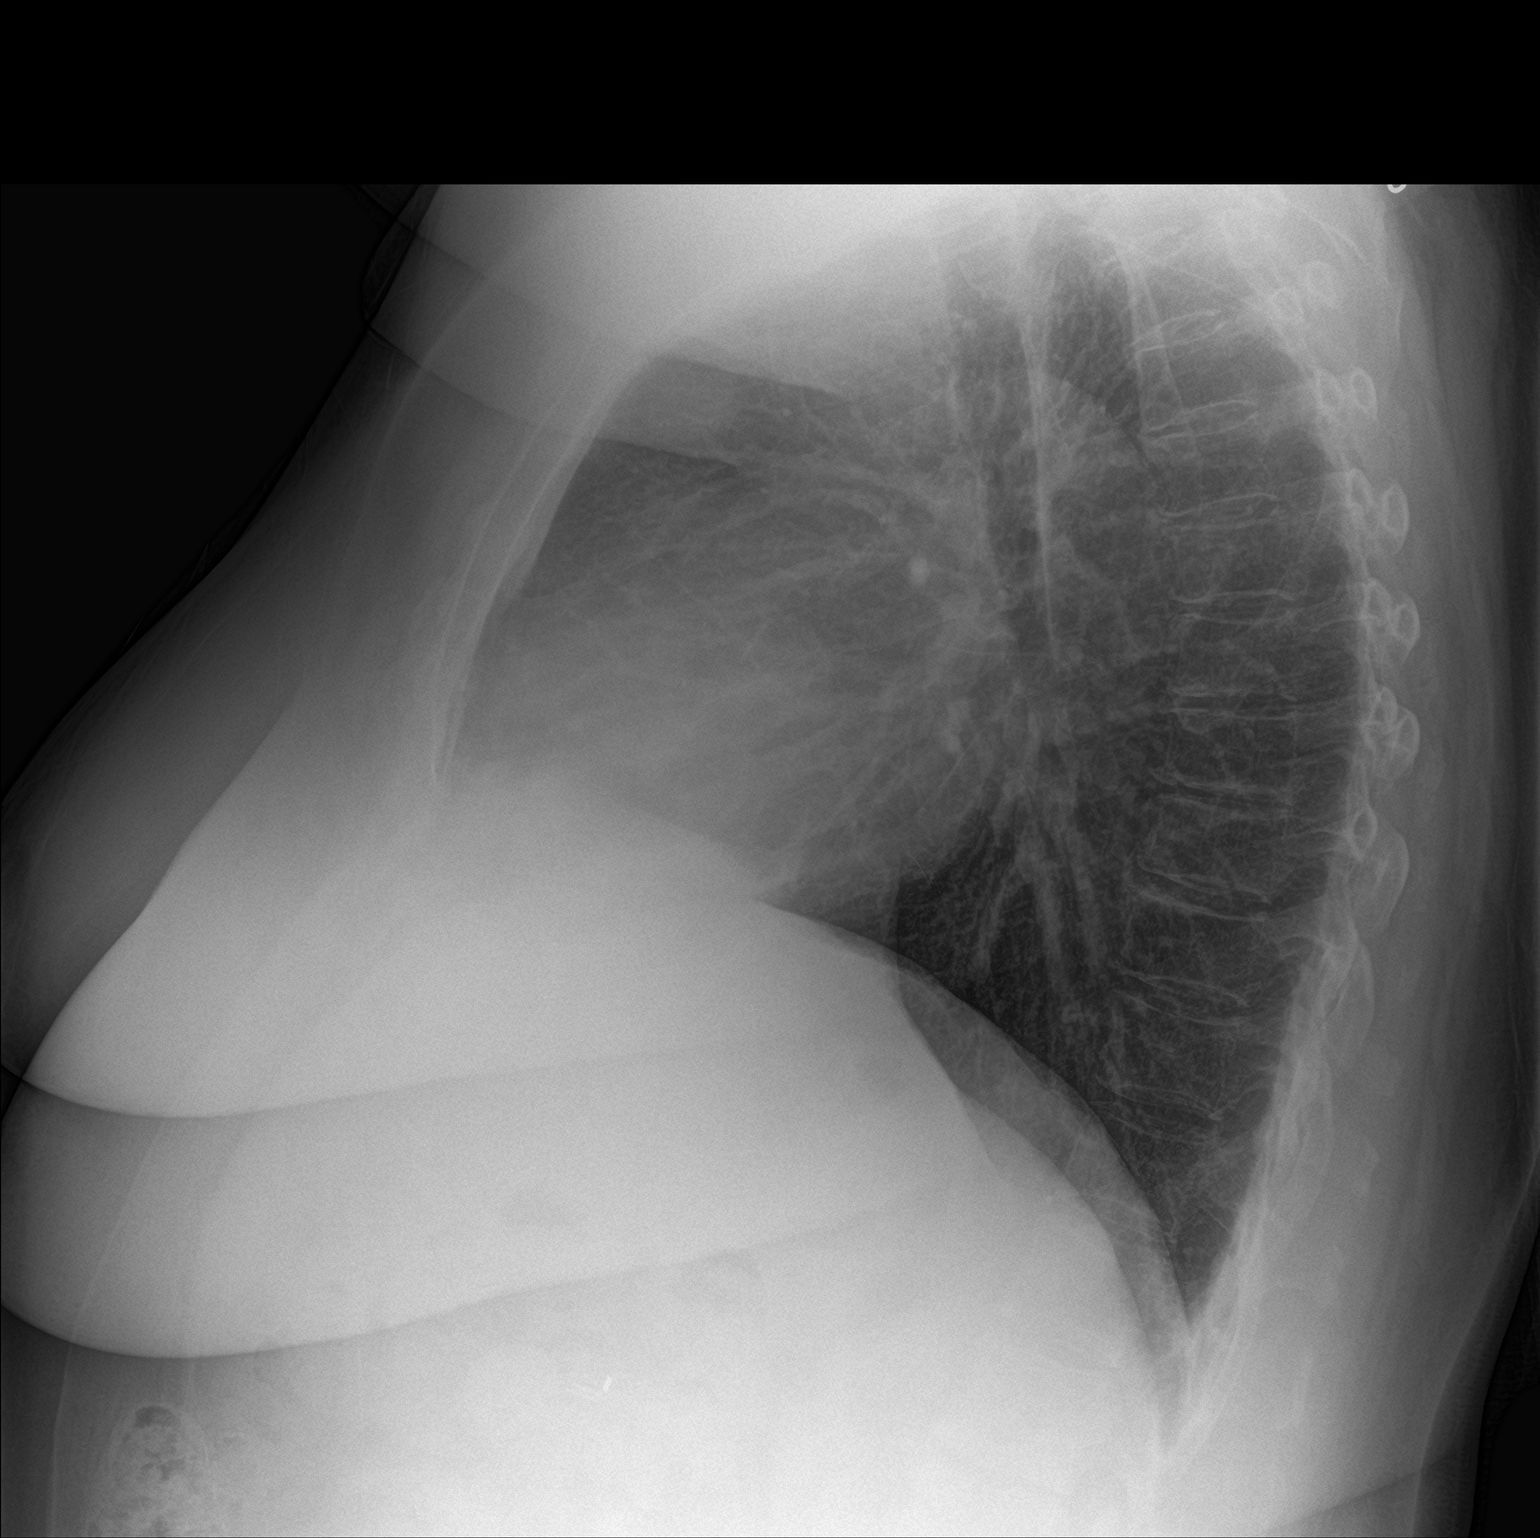

[2 of 2 positions shown; findings below may reference images not displayed]

FINDINGS: Cardiomegaly is stable. Overall cardiomediastinal silhouette is
stable. Lungs are clear. No pleural effusion or pneumothorax seen.
Osseous structures about the chest are unremarkable.
IMPRESSION: No active cardiopulmonary disease. No evidence of pneumonia or
pulmonary edema.

Stable mild cardiomegaly.

## 2020-07-21 DIAGNOSIS — R05 Cough: Secondary | ICD-10-CM | POA: Diagnosis not present

## 2020-07-27 DIAGNOSIS — G4733 Obstructive sleep apnea (adult) (pediatric): Secondary | ICD-10-CM | POA: Diagnosis not present

## 2020-07-27 DIAGNOSIS — J45909 Unspecified asthma, uncomplicated: Secondary | ICD-10-CM | POA: Diagnosis not present

## 2020-07-27 DIAGNOSIS — R9431 Abnormal electrocardiogram [ECG] [EKG]: Secondary | ICD-10-CM | POA: Diagnosis not present

## 2020-07-27 DIAGNOSIS — Z885 Allergy status to narcotic agent status: Secondary | ICD-10-CM | POA: Diagnosis not present

## 2020-07-27 DIAGNOSIS — R142 Eructation: Secondary | ICD-10-CM | POA: Diagnosis not present

## 2020-07-27 DIAGNOSIS — Z79899 Other long term (current) drug therapy: Secondary | ICD-10-CM | POA: Diagnosis not present

## 2020-07-27 DIAGNOSIS — R110 Nausea: Secondary | ICD-10-CM | POA: Diagnosis not present

## 2020-07-27 DIAGNOSIS — I10 Essential (primary) hypertension: Secondary | ICD-10-CM | POA: Diagnosis not present

## 2020-07-27 DIAGNOSIS — R079 Chest pain, unspecified: Secondary | ICD-10-CM | POA: Diagnosis not present

## 2020-07-27 DIAGNOSIS — R0602 Shortness of breath: Secondary | ICD-10-CM | POA: Diagnosis not present

## 2020-07-27 DIAGNOSIS — K219 Gastro-esophageal reflux disease without esophagitis: Secondary | ICD-10-CM | POA: Diagnosis not present

## 2020-07-27 DIAGNOSIS — E1142 Type 2 diabetes mellitus with diabetic polyneuropathy: Secondary | ICD-10-CM | POA: Diagnosis not present

## 2020-07-27 DIAGNOSIS — G2581 Restless legs syndrome: Secondary | ICD-10-CM | POA: Diagnosis not present

## 2020-07-27 DIAGNOSIS — M329 Systemic lupus erythematosus, unspecified: Secondary | ICD-10-CM | POA: Diagnosis not present

## 2020-07-27 DIAGNOSIS — Z96659 Presence of unspecified artificial knee joint: Secondary | ICD-10-CM | POA: Diagnosis not present

## 2020-07-27 DIAGNOSIS — M170 Bilateral primary osteoarthritis of knee: Secondary | ICD-10-CM | POA: Diagnosis not present

## 2020-07-27 DIAGNOSIS — Z888 Allergy status to other drugs, medicaments and biological substances status: Secondary | ICD-10-CM | POA: Diagnosis not present

## 2020-07-27 DIAGNOSIS — Z86718 Personal history of other venous thrombosis and embolism: Secondary | ICD-10-CM | POA: Diagnosis not present

## 2020-07-27 DIAGNOSIS — Z9049 Acquired absence of other specified parts of digestive tract: Secondary | ICD-10-CM | POA: Diagnosis not present

## 2020-07-27 DIAGNOSIS — E785 Hyperlipidemia, unspecified: Secondary | ICD-10-CM | POA: Diagnosis not present

## 2020-07-27 DIAGNOSIS — Z825 Family history of asthma and other chronic lower respiratory diseases: Secondary | ICD-10-CM | POA: Diagnosis not present

## 2020-07-27 DIAGNOSIS — Z86711 Personal history of pulmonary embolism: Secondary | ICD-10-CM | POA: Diagnosis not present

## 2020-07-27 DIAGNOSIS — M797 Fibromyalgia: Secondary | ICD-10-CM | POA: Diagnosis not present

## 2020-08-07 DIAGNOSIS — M1990 Unspecified osteoarthritis, unspecified site: Secondary | ICD-10-CM | POA: Diagnosis not present

## 2020-08-07 DIAGNOSIS — M792 Neuralgia and neuritis, unspecified: Secondary | ICD-10-CM | POA: Diagnosis not present

## 2020-08-07 DIAGNOSIS — M47816 Spondylosis without myelopathy or radiculopathy, lumbar region: Secondary | ICD-10-CM | POA: Diagnosis not present

## 2020-08-07 DIAGNOSIS — Z6841 Body Mass Index (BMI) 40.0 and over, adult: Secondary | ICD-10-CM | POA: Diagnosis not present

## 2020-08-07 DIAGNOSIS — M5412 Radiculopathy, cervical region: Secondary | ICD-10-CM | POA: Diagnosis not present

## 2020-08-12 DIAGNOSIS — M791 Myalgia, unspecified site: Secondary | ICD-10-CM | POA: Diagnosis not present

## 2020-08-12 DIAGNOSIS — M5412 Radiculopathy, cervical region: Secondary | ICD-10-CM | POA: Diagnosis not present

## 2020-08-13 DIAGNOSIS — K5900 Constipation, unspecified: Secondary | ICD-10-CM | POA: Diagnosis not present

## 2020-08-26 DIAGNOSIS — R202 Paresthesia of skin: Secondary | ICD-10-CM | POA: Diagnosis not present

## 2020-08-26 DIAGNOSIS — R079 Chest pain, unspecified: Secondary | ICD-10-CM | POA: Diagnosis not present

## 2020-08-26 DIAGNOSIS — Z86711 Personal history of pulmonary embolism: Secondary | ICD-10-CM | POA: Diagnosis not present

## 2020-08-26 DIAGNOSIS — E559 Vitamin D deficiency, unspecified: Secondary | ICD-10-CM | POA: Diagnosis not present

## 2020-08-26 DIAGNOSIS — K219 Gastro-esophageal reflux disease without esophagitis: Secondary | ICD-10-CM | POA: Diagnosis not present

## 2020-08-26 DIAGNOSIS — G2581 Restless legs syndrome: Secondary | ICD-10-CM | POA: Diagnosis not present

## 2020-08-26 DIAGNOSIS — R410 Disorientation, unspecified: Secondary | ICD-10-CM | POA: Diagnosis not present

## 2020-08-26 DIAGNOSIS — R519 Headache, unspecified: Secondary | ICD-10-CM | POA: Diagnosis not present

## 2020-08-26 DIAGNOSIS — Z86718 Personal history of other venous thrombosis and embolism: Secondary | ICD-10-CM | POA: Diagnosis not present

## 2020-08-26 DIAGNOSIS — K760 Fatty (change of) liver, not elsewhere classified: Secondary | ICD-10-CM | POA: Diagnosis not present

## 2020-08-26 DIAGNOSIS — J45909 Unspecified asthma, uncomplicated: Secondary | ICD-10-CM | POA: Diagnosis not present

## 2020-08-26 DIAGNOSIS — M797 Fibromyalgia: Secondary | ICD-10-CM | POA: Diagnosis not present

## 2020-08-26 DIAGNOSIS — M2042 Other hammer toe(s) (acquired), left foot: Secondary | ICD-10-CM | POA: Diagnosis not present

## 2020-08-26 DIAGNOSIS — I10 Essential (primary) hypertension: Secondary | ICD-10-CM | POA: Diagnosis not present

## 2020-08-26 DIAGNOSIS — Z9049 Acquired absence of other specified parts of digestive tract: Secondary | ICD-10-CM | POA: Diagnosis not present

## 2020-08-26 DIAGNOSIS — H532 Diplopia: Secondary | ICD-10-CM | POA: Diagnosis not present

## 2020-08-26 DIAGNOSIS — H9190 Unspecified hearing loss, unspecified ear: Secondary | ICD-10-CM | POA: Diagnosis not present

## 2020-08-26 DIAGNOSIS — H538 Other visual disturbances: Secondary | ICD-10-CM | POA: Diagnosis not present

## 2020-08-26 DIAGNOSIS — G500 Trigeminal neuralgia: Secondary | ICD-10-CM | POA: Diagnosis not present

## 2020-08-26 DIAGNOSIS — M170 Bilateral primary osteoarthritis of knee: Secondary | ICD-10-CM | POA: Diagnosis not present

## 2020-08-26 DIAGNOSIS — Z91018 Allergy to other foods: Secondary | ICD-10-CM | POA: Diagnosis not present

## 2020-08-26 DIAGNOSIS — Z888 Allergy status to other drugs, medicaments and biological substances status: Secondary | ICD-10-CM | POA: Diagnosis not present

## 2020-08-26 DIAGNOSIS — Z6841 Body Mass Index (BMI) 40.0 and over, adult: Secondary | ICD-10-CM | POA: Diagnosis not present

## 2020-08-26 DIAGNOSIS — T50905D Adverse effect of unspecified drugs, medicaments and biological substances, subsequent encounter: Secondary | ICD-10-CM | POA: Diagnosis not present

## 2020-08-26 DIAGNOSIS — M8588 Other specified disorders of bone density and structure, other site: Secondary | ICD-10-CM | POA: Diagnosis not present

## 2020-08-26 DIAGNOSIS — M320 Drug-induced systemic lupus erythematosus: Secondary | ICD-10-CM | POA: Diagnosis not present

## 2020-08-26 DIAGNOSIS — R42 Dizziness and giddiness: Secondary | ICD-10-CM | POA: Diagnosis not present

## 2020-08-26 DIAGNOSIS — E1142 Type 2 diabetes mellitus with diabetic polyneuropathy: Secondary | ICD-10-CM | POA: Diagnosis not present

## 2020-08-26 DIAGNOSIS — G4733 Obstructive sleep apnea (adult) (pediatric): Secondary | ICD-10-CM | POA: Diagnosis not present

## 2020-08-27 DIAGNOSIS — G8929 Other chronic pain: Secondary | ICD-10-CM | POA: Diagnosis not present

## 2020-08-27 DIAGNOSIS — G4733 Obstructive sleep apnea (adult) (pediatric): Secondary | ICD-10-CM | POA: Diagnosis not present

## 2020-08-27 DIAGNOSIS — M797 Fibromyalgia: Secondary | ICD-10-CM | POA: Diagnosis not present

## 2020-08-27 DIAGNOSIS — J329 Chronic sinusitis, unspecified: Secondary | ICD-10-CM | POA: Diagnosis not present

## 2020-08-27 DIAGNOSIS — E1142 Type 2 diabetes mellitus with diabetic polyneuropathy: Secondary | ICD-10-CM | POA: Diagnosis not present

## 2020-08-27 DIAGNOSIS — Z86718 Personal history of other venous thrombosis and embolism: Secondary | ICD-10-CM | POA: Diagnosis not present

## 2020-08-27 DIAGNOSIS — G43109 Migraine with aura, not intractable, without status migrainosus: Secondary | ICD-10-CM | POA: Diagnosis not present

## 2020-08-27 DIAGNOSIS — H9190 Unspecified hearing loss, unspecified ear: Secondary | ICD-10-CM | POA: Diagnosis not present

## 2020-08-27 DIAGNOSIS — M26621 Arthralgia of right temporomandibular joint: Secondary | ICD-10-CM | POA: Diagnosis not present

## 2020-08-27 DIAGNOSIS — E0510 Thyrotoxicosis with toxic single thyroid nodule without thyrotoxic crisis or storm: Secondary | ICD-10-CM | POA: Diagnosis not present

## 2020-08-27 DIAGNOSIS — R221 Localized swelling, mass and lump, neck: Secondary | ICD-10-CM | POA: Diagnosis not present

## 2020-08-27 DIAGNOSIS — R29810 Facial weakness: Secondary | ICD-10-CM | POA: Diagnosis not present

## 2020-08-27 DIAGNOSIS — K219 Gastro-esophageal reflux disease without esophagitis: Secondary | ICD-10-CM | POA: Diagnosis not present

## 2020-08-27 DIAGNOSIS — I10 Essential (primary) hypertension: Secondary | ICD-10-CM | POA: Diagnosis not present

## 2020-08-27 DIAGNOSIS — J45909 Unspecified asthma, uncomplicated: Secondary | ICD-10-CM | POA: Diagnosis not present

## 2020-08-27 DIAGNOSIS — H93A1 Pulsatile tinnitus, right ear: Secondary | ICD-10-CM | POA: Diagnosis not present

## 2020-08-27 DIAGNOSIS — H9203 Otalgia, bilateral: Secondary | ICD-10-CM | POA: Diagnosis not present

## 2020-08-27 DIAGNOSIS — M2420 Disorder of ligament, unspecified site: Secondary | ICD-10-CM | POA: Diagnosis not present

## 2020-08-27 DIAGNOSIS — M542 Cervicalgia: Secondary | ICD-10-CM | POA: Diagnosis not present

## 2020-08-27 DIAGNOSIS — J3489 Other specified disorders of nose and nasal sinuses: Secondary | ICD-10-CM | POA: Diagnosis not present

## 2020-08-27 DIAGNOSIS — E785 Hyperlipidemia, unspecified: Secondary | ICD-10-CM | POA: Diagnosis not present

## 2020-08-27 DIAGNOSIS — Z6841 Body Mass Index (BMI) 40.0 and over, adult: Secondary | ICD-10-CM | POA: Diagnosis not present

## 2020-08-27 DIAGNOSIS — M79601 Pain in right arm: Secondary | ICD-10-CM | POA: Diagnosis not present

## 2020-08-27 DIAGNOSIS — K117 Disturbances of salivary secretion: Secondary | ICD-10-CM | POA: Diagnosis not present

## 2020-08-27 DIAGNOSIS — M26629 Arthralgia of temporomandibular joint, unspecified side: Secondary | ICD-10-CM | POA: Diagnosis not present

## 2020-09-02 DIAGNOSIS — I10 Essential (primary) hypertension: Secondary | ICD-10-CM | POA: Diagnosis not present

## 2020-09-02 DIAGNOSIS — Z79899 Other long term (current) drug therapy: Secondary | ICD-10-CM | POA: Diagnosis not present

## 2020-09-02 DIAGNOSIS — M2420 Disorder of ligament, unspecified site: Secondary | ICD-10-CM | POA: Diagnosis not present

## 2020-09-02 DIAGNOSIS — H4020X0 Unspecified primary angle-closure glaucoma, stage unspecified: Secondary | ICD-10-CM | POA: Diagnosis not present

## 2020-09-02 DIAGNOSIS — H402230 Chronic angle-closure glaucoma, bilateral, stage unspecified: Secondary | ICD-10-CM | POA: Diagnosis not present

## 2020-09-02 DIAGNOSIS — G43719 Chronic migraine without aura, intractable, without status migrainosus: Secondary | ICD-10-CM | POA: Diagnosis not present

## 2020-09-02 DIAGNOSIS — Z6841 Body Mass Index (BMI) 40.0 and over, adult: Secondary | ICD-10-CM | POA: Diagnosis not present

## 2020-09-07 DIAGNOSIS — G2581 Restless legs syndrome: Secondary | ICD-10-CM | POA: Diagnosis not present

## 2020-09-07 DIAGNOSIS — L932 Other local lupus erythematosus: Secondary | ICD-10-CM | POA: Diagnosis not present

## 2020-09-07 DIAGNOSIS — Z86718 Personal history of other venous thrombosis and embolism: Secondary | ICD-10-CM | POA: Diagnosis not present

## 2020-09-07 DIAGNOSIS — Z9049 Acquired absence of other specified parts of digestive tract: Secondary | ICD-10-CM | POA: Diagnosis not present

## 2020-09-07 DIAGNOSIS — R1032 Left lower quadrant pain: Secondary | ICD-10-CM | POA: Diagnosis not present

## 2020-09-07 DIAGNOSIS — E119 Type 2 diabetes mellitus without complications: Secondary | ICD-10-CM | POA: Diagnosis not present

## 2020-09-07 DIAGNOSIS — K219 Gastro-esophageal reflux disease without esophagitis: Secondary | ICD-10-CM | POA: Diagnosis not present

## 2020-09-07 DIAGNOSIS — J45909 Unspecified asthma, uncomplicated: Secondary | ICD-10-CM | POA: Diagnosis not present

## 2020-09-07 DIAGNOSIS — I10 Essential (primary) hypertension: Secondary | ICD-10-CM | POA: Diagnosis not present

## 2020-09-07 DIAGNOSIS — E079 Disorder of thyroid, unspecified: Secondary | ICD-10-CM | POA: Diagnosis not present

## 2020-09-07 DIAGNOSIS — I499 Cardiac arrhythmia, unspecified: Secondary | ICD-10-CM | POA: Diagnosis not present

## 2020-09-07 DIAGNOSIS — K529 Noninfective gastroenteritis and colitis, unspecified: Secondary | ICD-10-CM | POA: Diagnosis not present

## 2020-09-07 DIAGNOSIS — R110 Nausea: Secondary | ICD-10-CM | POA: Diagnosis not present

## 2020-09-07 DIAGNOSIS — K760 Fatty (change of) liver, not elsewhere classified: Secondary | ICD-10-CM | POA: Diagnosis not present

## 2020-09-07 DIAGNOSIS — Z79899 Other long term (current) drug therapy: Secondary | ICD-10-CM | POA: Diagnosis not present

## 2020-09-07 DIAGNOSIS — Z86711 Personal history of pulmonary embolism: Secondary | ICD-10-CM | POA: Diagnosis not present

## 2020-09-07 DIAGNOSIS — Q794 Prune belly syndrome: Secondary | ICD-10-CM | POA: Diagnosis not present

## 2020-09-07 DIAGNOSIS — Z23 Encounter for immunization: Secondary | ICD-10-CM | POA: Diagnosis not present

## 2020-09-07 DIAGNOSIS — E1151 Type 2 diabetes mellitus with diabetic peripheral angiopathy without gangrene: Secondary | ICD-10-CM | POA: Diagnosis not present

## 2020-09-07 DIAGNOSIS — R198 Other specified symptoms and signs involving the digestive system and abdomen: Secondary | ICD-10-CM | POA: Diagnosis not present

## 2020-09-07 DIAGNOSIS — R9431 Abnormal electrocardiogram [ECG] [EKG]: Secondary | ICD-10-CM | POA: Diagnosis not present

## 2020-09-07 DIAGNOSIS — R5383 Other fatigue: Secondary | ICD-10-CM | POA: Diagnosis not present

## 2020-09-07 DIAGNOSIS — E785 Hyperlipidemia, unspecified: Secondary | ICD-10-CM | POA: Diagnosis not present

## 2020-09-07 DIAGNOSIS — Z6841 Body Mass Index (BMI) 40.0 and over, adult: Secondary | ICD-10-CM | POA: Diagnosis not present

## 2020-09-07 DIAGNOSIS — E1142 Type 2 diabetes mellitus with diabetic polyneuropathy: Secondary | ICD-10-CM | POA: Diagnosis not present

## 2020-09-07 DIAGNOSIS — G43909 Migraine, unspecified, not intractable, without status migrainosus: Secondary | ICD-10-CM | POA: Diagnosis not present

## 2020-09-16 DIAGNOSIS — R198 Other specified symptoms and signs involving the digestive system and abdomen: Secondary | ICD-10-CM | POA: Diagnosis not present

## 2020-09-16 DIAGNOSIS — I10 Essential (primary) hypertension: Secondary | ICD-10-CM | POA: Diagnosis not present

## 2020-09-16 DIAGNOSIS — E119 Type 2 diabetes mellitus without complications: Secondary | ICD-10-CM | POA: Diagnosis not present

## 2020-09-25 DIAGNOSIS — R0989 Other specified symptoms and signs involving the circulatory and respiratory systems: Secondary | ICD-10-CM | POA: Diagnosis not present

## 2020-09-25 DIAGNOSIS — I10 Essential (primary) hypertension: Secondary | ICD-10-CM | POA: Diagnosis not present

## 2020-09-25 DIAGNOSIS — I951 Orthostatic hypotension: Secondary | ICD-10-CM | POA: Diagnosis not present

## 2020-09-25 DIAGNOSIS — Z6841 Body Mass Index (BMI) 40.0 and over, adult: Secondary | ICD-10-CM | POA: Diagnosis not present

## 2020-09-30 DIAGNOSIS — I498 Other specified cardiac arrhythmias: Secondary | ICD-10-CM | POA: Diagnosis not present

## 2020-09-30 DIAGNOSIS — I10 Essential (primary) hypertension: Secondary | ICD-10-CM | POA: Diagnosis not present

## 2020-09-30 DIAGNOSIS — H402230 Chronic angle-closure glaucoma, bilateral, stage unspecified: Secondary | ICD-10-CM | POA: Diagnosis not present

## 2020-09-30 DIAGNOSIS — K5909 Other constipation: Secondary | ICD-10-CM | POA: Diagnosis not present

## 2020-09-30 DIAGNOSIS — H3554 Dystrophies primarily involving the retinal pigment epithelium: Secondary | ICD-10-CM | POA: Diagnosis not present

## 2020-09-30 DIAGNOSIS — E119 Type 2 diabetes mellitus without complications: Secondary | ICD-10-CM | POA: Diagnosis not present

## 2020-09-30 DIAGNOSIS — H538 Other visual disturbances: Secondary | ICD-10-CM | POA: Diagnosis not present

## 2020-09-30 DIAGNOSIS — H40009 Preglaucoma, unspecified, unspecified eye: Secondary | ICD-10-CM | POA: Diagnosis not present

## 2020-09-30 DIAGNOSIS — I951 Orthostatic hypotension: Secondary | ICD-10-CM | POA: Diagnosis not present

## 2020-09-30 DIAGNOSIS — E785 Hyperlipidemia, unspecified: Secondary | ICD-10-CM | POA: Diagnosis not present

## 2020-10-05 DIAGNOSIS — H402230 Chronic angle-closure glaucoma, bilateral, stage unspecified: Secondary | ICD-10-CM | POA: Diagnosis not present

## 2020-10-06 DIAGNOSIS — I10 Essential (primary) hypertension: Secondary | ICD-10-CM | POA: Diagnosis not present

## 2020-10-06 DIAGNOSIS — R0989 Other specified symptoms and signs involving the circulatory and respiratory systems: Secondary | ICD-10-CM | POA: Diagnosis not present

## 2020-10-07 DIAGNOSIS — M26621 Arthralgia of right temporomandibular joint: Secondary | ICD-10-CM | POA: Diagnosis not present

## 2020-10-15 DIAGNOSIS — R0989 Other specified symptoms and signs involving the circulatory and respiratory systems: Secondary | ICD-10-CM | POA: Diagnosis not present

## 2020-10-15 DIAGNOSIS — I10 Essential (primary) hypertension: Secondary | ICD-10-CM | POA: Diagnosis not present

## 2020-10-23 DIAGNOSIS — J45909 Unspecified asthma, uncomplicated: Secondary | ICD-10-CM | POA: Diagnosis not present

## 2020-10-23 DIAGNOSIS — R0989 Other specified symptoms and signs involving the circulatory and respiratory systems: Secondary | ICD-10-CM | POA: Diagnosis not present

## 2020-10-23 DIAGNOSIS — K2270 Barrett's esophagus without dysplasia: Secondary | ICD-10-CM | POA: Diagnosis not present

## 2020-10-23 DIAGNOSIS — K625 Hemorrhage of anus and rectum: Secondary | ICD-10-CM | POA: Diagnosis not present

## 2020-10-23 DIAGNOSIS — E1122 Type 2 diabetes mellitus with diabetic chronic kidney disease: Secondary | ICD-10-CM | POA: Diagnosis not present

## 2020-10-23 DIAGNOSIS — Z880 Allergy status to penicillin: Secondary | ICD-10-CM | POA: Diagnosis not present

## 2020-10-23 DIAGNOSIS — Z6841 Body Mass Index (BMI) 40.0 and over, adult: Secondary | ICD-10-CM | POA: Diagnosis not present

## 2020-10-23 DIAGNOSIS — K5900 Constipation, unspecified: Secondary | ICD-10-CM | POA: Diagnosis not present

## 2020-10-23 DIAGNOSIS — H9190 Unspecified hearing loss, unspecified ear: Secondary | ICD-10-CM | POA: Diagnosis not present

## 2020-10-23 DIAGNOSIS — J329 Chronic sinusitis, unspecified: Secondary | ICD-10-CM | POA: Diagnosis not present

## 2020-10-23 DIAGNOSIS — I10 Essential (primary) hypertension: Secondary | ICD-10-CM | POA: Diagnosis not present

## 2020-10-23 DIAGNOSIS — Z86718 Personal history of other venous thrombosis and embolism: Secondary | ICD-10-CM | POA: Diagnosis not present

## 2020-10-23 DIAGNOSIS — M329 Systemic lupus erythematosus, unspecified: Secondary | ICD-10-CM | POA: Diagnosis not present

## 2020-10-23 DIAGNOSIS — Z7984 Long term (current) use of oral hypoglycemic drugs: Secondary | ICD-10-CM | POA: Diagnosis not present

## 2020-10-23 DIAGNOSIS — Z90722 Acquired absence of ovaries, bilateral: Secondary | ICD-10-CM | POA: Diagnosis not present

## 2020-10-23 DIAGNOSIS — Z89519 Acquired absence of unspecified leg below knee: Secondary | ICD-10-CM | POA: Diagnosis not present

## 2020-10-23 DIAGNOSIS — I498 Other specified cardiac arrhythmias: Secondary | ICD-10-CM | POA: Diagnosis not present

## 2020-10-23 DIAGNOSIS — F419 Anxiety disorder, unspecified: Secondary | ICD-10-CM | POA: Diagnosis not present

## 2020-10-23 DIAGNOSIS — E1142 Type 2 diabetes mellitus with diabetic polyneuropathy: Secondary | ICD-10-CM | POA: Diagnosis not present

## 2020-10-23 DIAGNOSIS — K760 Fatty (change of) liver, not elsewhere classified: Secondary | ICD-10-CM | POA: Diagnosis not present

## 2020-10-23 DIAGNOSIS — E669 Obesity, unspecified: Secondary | ICD-10-CM | POA: Diagnosis not present

## 2020-10-23 DIAGNOSIS — Z791 Long term (current) use of non-steroidal anti-inflammatories (NSAID): Secondary | ICD-10-CM | POA: Diagnosis not present

## 2020-10-23 DIAGNOSIS — N182 Chronic kidney disease, stage 2 (mild): Secondary | ICD-10-CM | POA: Diagnosis not present

## 2020-10-23 DIAGNOSIS — Z91018 Allergy to other foods: Secondary | ICD-10-CM | POA: Diagnosis not present

## 2020-10-23 DIAGNOSIS — I2699 Other pulmonary embolism without acute cor pulmonale: Secondary | ICD-10-CM | POA: Diagnosis not present

## 2020-10-23 DIAGNOSIS — Z961 Presence of intraocular lens: Secondary | ICD-10-CM | POA: Diagnosis not present

## 2020-10-23 DIAGNOSIS — K7402 Hepatic fibrosis, advanced fibrosis: Secondary | ICD-10-CM | POA: Diagnosis not present

## 2020-10-30 DIAGNOSIS — M7918 Myalgia, other site: Secondary | ICD-10-CM | POA: Diagnosis not present

## 2020-10-30 DIAGNOSIS — M791 Myalgia, unspecified site: Secondary | ICD-10-CM | POA: Diagnosis not present

## 2020-10-30 DIAGNOSIS — Z6841 Body Mass Index (BMI) 40.0 and over, adult: Secondary | ICD-10-CM | POA: Diagnosis not present

## 2020-10-30 DIAGNOSIS — M47816 Spondylosis without myelopathy or radiculopathy, lumbar region: Secondary | ICD-10-CM | POA: Diagnosis not present

## 2020-10-30 DIAGNOSIS — M792 Neuralgia and neuritis, unspecified: Secondary | ICD-10-CM | POA: Diagnosis not present

## 2020-11-03 DIAGNOSIS — Z6841 Body Mass Index (BMI) 40.0 and over, adult: Secondary | ICD-10-CM | POA: Diagnosis not present

## 2020-11-03 DIAGNOSIS — I10 Essential (primary) hypertension: Secondary | ICD-10-CM | POA: Diagnosis not present

## 2020-11-03 DIAGNOSIS — I951 Orthostatic hypotension: Secondary | ICD-10-CM | POA: Diagnosis not present

## 2020-11-11 DIAGNOSIS — M791 Myalgia, unspecified site: Secondary | ICD-10-CM | POA: Diagnosis not present

## 2020-11-11 DIAGNOSIS — M7918 Myalgia, other site: Secondary | ICD-10-CM | POA: Diagnosis not present

## 2020-11-19 DIAGNOSIS — M2550 Pain in unspecified joint: Secondary | ICD-10-CM | POA: Diagnosis not present

## 2020-11-19 DIAGNOSIS — E060 Acute thyroiditis: Secondary | ICD-10-CM | POA: Diagnosis not present

## 2020-11-19 DIAGNOSIS — I10 Essential (primary) hypertension: Secondary | ICD-10-CM | POA: Diagnosis not present

## 2020-11-19 DIAGNOSIS — E119 Type 2 diabetes mellitus without complications: Secondary | ICD-10-CM | POA: Diagnosis not present

## 2020-11-19 DIAGNOSIS — R7989 Other specified abnormal findings of blood chemistry: Secondary | ICD-10-CM | POA: Diagnosis not present

## 2020-11-19 DIAGNOSIS — K760 Fatty (change of) liver, not elsewhere classified: Secondary | ICD-10-CM | POA: Diagnosis not present

## 2020-11-19 DIAGNOSIS — E785 Hyperlipidemia, unspecified: Secondary | ICD-10-CM | POA: Diagnosis not present

## 2020-11-19 DIAGNOSIS — R42 Dizziness and giddiness: Secondary | ICD-10-CM | POA: Diagnosis not present

## 2020-11-20 DIAGNOSIS — R7989 Other specified abnormal findings of blood chemistry: Secondary | ICD-10-CM | POA: Diagnosis not present

## 2020-11-20 DIAGNOSIS — E060 Acute thyroiditis: Secondary | ICD-10-CM | POA: Diagnosis not present

## 2020-11-20 DIAGNOSIS — K760 Fatty (change of) liver, not elsewhere classified: Secondary | ICD-10-CM | POA: Diagnosis not present

## 2020-11-20 DIAGNOSIS — E119 Type 2 diabetes mellitus without complications: Secondary | ICD-10-CM | POA: Diagnosis not present

## 2020-11-20 DIAGNOSIS — I10 Essential (primary) hypertension: Secondary | ICD-10-CM | POA: Diagnosis not present

## 2020-11-20 DIAGNOSIS — E785 Hyperlipidemia, unspecified: Secondary | ICD-10-CM | POA: Diagnosis not present

## 2020-11-27 DIAGNOSIS — K5641 Fecal impaction: Secondary | ICD-10-CM | POA: Diagnosis not present

## 2020-11-27 DIAGNOSIS — K5900 Constipation, unspecified: Secondary | ICD-10-CM | POA: Diagnosis not present

## 2020-12-01 DIAGNOSIS — H532 Diplopia: Secondary | ICD-10-CM | POA: Diagnosis not present

## 2020-12-02 DIAGNOSIS — Z1231 Encounter for screening mammogram for malignant neoplasm of breast: Secondary | ICD-10-CM | POA: Diagnosis not present

## 2020-12-04 DIAGNOSIS — M25521 Pain in right elbow: Secondary | ICD-10-CM | POA: Diagnosis not present

## 2020-12-15 DIAGNOSIS — E041 Nontoxic single thyroid nodule: Secondary | ICD-10-CM | POA: Diagnosis not present

## 2020-12-15 DIAGNOSIS — M2420 Disorder of ligament, unspecified site: Secondary | ICD-10-CM | POA: Diagnosis not present

## 2020-12-15 DIAGNOSIS — E1139 Type 2 diabetes mellitus with other diabetic ophthalmic complication: Secondary | ICD-10-CM | POA: Diagnosis not present

## 2020-12-15 DIAGNOSIS — G4733 Obstructive sleep apnea (adult) (pediatric): Secondary | ICD-10-CM | POA: Diagnosis not present

## 2020-12-21 DIAGNOSIS — M25512 Pain in left shoulder: Secondary | ICD-10-CM | POA: Diagnosis not present

## 2020-12-21 DIAGNOSIS — I10 Essential (primary) hypertension: Secondary | ICD-10-CM | POA: Diagnosis not present

## 2020-12-21 DIAGNOSIS — M25561 Pain in right knee: Secondary | ICD-10-CM | POA: Diagnosis not present

## 2020-12-21 DIAGNOSIS — Z96651 Presence of right artificial knee joint: Secondary | ICD-10-CM | POA: Diagnosis not present

## 2020-12-21 DIAGNOSIS — I951 Orthostatic hypotension: Secondary | ICD-10-CM | POA: Diagnosis not present

## 2020-12-21 DIAGNOSIS — Z6841 Body Mass Index (BMI) 40.0 and over, adult: Secondary | ICD-10-CM | POA: Diagnosis not present

## 2020-12-21 DIAGNOSIS — W19XXXS Unspecified fall, sequela: Secondary | ICD-10-CM | POA: Diagnosis not present

## 2020-12-21 DIAGNOSIS — M25511 Pain in right shoulder: Secondary | ICD-10-CM | POA: Diagnosis not present

## 2020-12-21 DIAGNOSIS — G8929 Other chronic pain: Secondary | ICD-10-CM | POA: Diagnosis not present

## 2021-01-06 DIAGNOSIS — H35723 Serous detachment of retinal pigment epithelium, bilateral: Secondary | ICD-10-CM | POA: Diagnosis not present

## 2021-01-06 DIAGNOSIS — H4020X0 Unspecified primary angle-closure glaucoma, stage unspecified: Secondary | ICD-10-CM | POA: Diagnosis not present

## 2021-01-13 DIAGNOSIS — R519 Headache, unspecified: Secondary | ICD-10-CM | POA: Diagnosis not present

## 2021-01-13 DIAGNOSIS — G43719 Chronic migraine without aura, intractable, without status migrainosus: Secondary | ICD-10-CM | POA: Diagnosis not present

## 2021-01-13 DIAGNOSIS — Z6841 Body Mass Index (BMI) 40.0 and over, adult: Secondary | ICD-10-CM | POA: Diagnosis not present

## 2021-01-23 DIAGNOSIS — M542 Cervicalgia: Secondary | ICD-10-CM | POA: Diagnosis not present

## 2021-01-23 DIAGNOSIS — Z86711 Personal history of pulmonary embolism: Secondary | ICD-10-CM | POA: Diagnosis not present

## 2021-01-23 DIAGNOSIS — R519 Headache, unspecified: Secondary | ICD-10-CM | POA: Diagnosis not present

## 2021-01-23 DIAGNOSIS — M940 Chondrocostal junction syndrome [Tietze]: Secondary | ICD-10-CM | POA: Diagnosis not present

## 2021-01-23 DIAGNOSIS — G4700 Insomnia, unspecified: Secondary | ICD-10-CM | POA: Diagnosis not present

## 2021-01-23 DIAGNOSIS — Z6841 Body Mass Index (BMI) 40.0 and over, adult: Secondary | ICD-10-CM | POA: Diagnosis not present

## 2021-01-23 DIAGNOSIS — R079 Chest pain, unspecified: Secondary | ICD-10-CM | POA: Diagnosis not present

## 2021-01-23 DIAGNOSIS — M25512 Pain in left shoulder: Secondary | ICD-10-CM | POA: Diagnosis not present

## 2021-01-23 DIAGNOSIS — K760 Fatty (change of) liver, not elsewhere classified: Secondary | ICD-10-CM | POA: Diagnosis not present

## 2021-01-23 DIAGNOSIS — K219 Gastro-esophageal reflux disease without esophagitis: Secondary | ICD-10-CM | POA: Diagnosis not present

## 2021-01-23 DIAGNOSIS — I10 Essential (primary) hypertension: Secondary | ICD-10-CM | POA: Diagnosis not present

## 2021-01-23 DIAGNOSIS — R42 Dizziness and giddiness: Secondary | ICD-10-CM | POA: Diagnosis not present

## 2021-01-23 DIAGNOSIS — E042 Nontoxic multinodular goiter: Secondary | ICD-10-CM | POA: Diagnosis not present

## 2021-01-23 DIAGNOSIS — G4733 Obstructive sleep apnea (adult) (pediatric): Secondary | ICD-10-CM | POA: Diagnosis not present

## 2021-01-23 DIAGNOSIS — Z20822 Contact with and (suspected) exposure to covid-19: Secondary | ICD-10-CM | POA: Diagnosis not present

## 2021-01-23 DIAGNOSIS — G43909 Migraine, unspecified, not intractable, without status migrainosus: Secondary | ICD-10-CM | POA: Diagnosis not present

## 2021-01-23 DIAGNOSIS — M797 Fibromyalgia: Secondary | ICD-10-CM | POA: Diagnosis not present

## 2021-01-24 DIAGNOSIS — I10 Essential (primary) hypertension: Secondary | ICD-10-CM | POA: Diagnosis not present

## 2021-01-24 DIAGNOSIS — G4733 Obstructive sleep apnea (adult) (pediatric): Secondary | ICD-10-CM | POA: Diagnosis not present

## 2021-01-24 DIAGNOSIS — M5032 Other cervical disc degeneration, mid-cervical region, unspecified level: Secondary | ICD-10-CM | POA: Diagnosis not present

## 2021-01-24 DIAGNOSIS — R0789 Other chest pain: Secondary | ICD-10-CM | POA: Diagnosis not present

## 2021-01-24 DIAGNOSIS — G4700 Insomnia, unspecified: Secondary | ICD-10-CM | POA: Diagnosis not present

## 2021-01-24 DIAGNOSIS — R519 Headache, unspecified: Secondary | ICD-10-CM | POA: Diagnosis not present

## 2021-01-24 DIAGNOSIS — R079 Chest pain, unspecified: Secondary | ICD-10-CM | POA: Diagnosis not present

## 2021-01-24 DIAGNOSIS — M542 Cervicalgia: Secondary | ICD-10-CM | POA: Diagnosis not present

## 2021-01-27 DIAGNOSIS — R519 Headache, unspecified: Secondary | ICD-10-CM | POA: Diagnosis not present

## 2021-01-27 DIAGNOSIS — G43719 Chronic migraine without aura, intractable, without status migrainosus: Secondary | ICD-10-CM | POA: Diagnosis not present

## 2021-01-27 DIAGNOSIS — G44229 Chronic tension-type headache, not intractable: Secondary | ICD-10-CM | POA: Diagnosis not present

## 2021-01-27 DIAGNOSIS — Z6841 Body Mass Index (BMI) 40.0 and over, adult: Secondary | ICD-10-CM | POA: Diagnosis not present

## 2021-01-27 DIAGNOSIS — G4733 Obstructive sleep apnea (adult) (pediatric): Secondary | ICD-10-CM | POA: Diagnosis not present

## 2021-01-27 DIAGNOSIS — M7918 Myalgia, other site: Secondary | ICD-10-CM | POA: Diagnosis not present

## 2021-01-27 DIAGNOSIS — G4440 Drug-induced headache, not elsewhere classified, not intractable: Secondary | ICD-10-CM | POA: Diagnosis not present

## 2021-02-02 DIAGNOSIS — M7912 Myalgia of auxiliary muscles, head and neck: Secondary | ICD-10-CM | POA: Diagnosis not present

## 2021-02-02 DIAGNOSIS — M47816 Spondylosis without myelopathy or radiculopathy, lumbar region: Secondary | ICD-10-CM | POA: Diagnosis not present

## 2021-02-02 DIAGNOSIS — M47817 Spondylosis without myelopathy or radiculopathy, lumbosacral region: Secondary | ICD-10-CM | POA: Diagnosis not present

## 2021-02-02 DIAGNOSIS — G8929 Other chronic pain: Secondary | ICD-10-CM | POA: Diagnosis not present

## 2021-02-02 DIAGNOSIS — M5412 Radiculopathy, cervical region: Secondary | ICD-10-CM | POA: Diagnosis not present

## 2021-02-04 DIAGNOSIS — G4733 Obstructive sleep apnea (adult) (pediatric): Secondary | ICD-10-CM | POA: Diagnosis not present

## 2021-02-04 DIAGNOSIS — Z09 Encounter for follow-up examination after completed treatment for conditions other than malignant neoplasm: Secondary | ICD-10-CM | POA: Diagnosis not present

## 2021-02-04 DIAGNOSIS — M940 Chondrocostal junction syndrome [Tietze]: Secondary | ICD-10-CM | POA: Diagnosis not present

## 2021-02-04 DIAGNOSIS — G43109 Migraine with aura, not intractable, without status migrainosus: Secondary | ICD-10-CM | POA: Diagnosis not present

## 2021-02-12 DIAGNOSIS — R110 Nausea: Secondary | ICD-10-CM | POA: Diagnosis not present

## 2021-02-12 DIAGNOSIS — K5900 Constipation, unspecified: Secondary | ICD-10-CM | POA: Diagnosis not present

## 2021-02-12 DIAGNOSIS — K760 Fatty (change of) liver, not elsewhere classified: Secondary | ICD-10-CM | POA: Diagnosis not present

## 2021-02-12 DIAGNOSIS — K2270 Barrett's esophagus without dysplasia: Secondary | ICD-10-CM | POA: Diagnosis not present

## 2021-02-12 DIAGNOSIS — Z6841 Body Mass Index (BMI) 40.0 and over, adult: Secondary | ICD-10-CM | POA: Diagnosis not present

## 2021-02-12 DIAGNOSIS — M940 Chondrocostal junction syndrome [Tietze]: Secondary | ICD-10-CM | POA: Diagnosis not present

## 2021-02-23 DIAGNOSIS — M47817 Spondylosis without myelopathy or radiculopathy, lumbosacral region: Secondary | ICD-10-CM | POA: Diagnosis not present

## 2021-02-23 DIAGNOSIS — M47816 Spondylosis without myelopathy or radiculopathy, lumbar region: Secondary | ICD-10-CM | POA: Diagnosis not present

## 2021-02-25 DIAGNOSIS — K219 Gastro-esophageal reflux disease without esophagitis: Secondary | ICD-10-CM | POA: Diagnosis not present

## 2021-02-25 DIAGNOSIS — E119 Type 2 diabetes mellitus without complications: Secondary | ICD-10-CM | POA: Diagnosis not present

## 2021-02-25 DIAGNOSIS — D538 Other specified nutritional anemias: Secondary | ICD-10-CM | POA: Diagnosis not present

## 2021-02-25 DIAGNOSIS — Z0389 Encounter for observation for other suspected diseases and conditions ruled out: Secondary | ICD-10-CM | POA: Diagnosis not present

## 2021-02-25 DIAGNOSIS — Z6841 Body Mass Index (BMI) 40.0 and over, adult: Secondary | ICD-10-CM | POA: Diagnosis not present

## 2021-02-25 DIAGNOSIS — I10 Essential (primary) hypertension: Secondary | ICD-10-CM | POA: Diagnosis not present

## 2021-02-25 DIAGNOSIS — G4733 Obstructive sleep apnea (adult) (pediatric): Secondary | ICD-10-CM | POA: Diagnosis not present

## 2021-03-19 DIAGNOSIS — E785 Hyperlipidemia, unspecified: Secondary | ICD-10-CM | POA: Diagnosis not present

## 2021-03-19 DIAGNOSIS — Z91018 Allergy to other foods: Secondary | ICD-10-CM | POA: Diagnosis not present

## 2021-03-19 DIAGNOSIS — Z888 Allergy status to other drugs, medicaments and biological substances status: Secondary | ICD-10-CM | POA: Diagnosis not present

## 2021-03-19 DIAGNOSIS — R7989 Other specified abnormal findings of blood chemistry: Secondary | ICD-10-CM | POA: Diagnosis not present

## 2021-03-19 DIAGNOSIS — R1032 Left lower quadrant pain: Secondary | ICD-10-CM | POA: Diagnosis not present

## 2021-03-19 DIAGNOSIS — Z79899 Other long term (current) drug therapy: Secondary | ICD-10-CM | POA: Diagnosis not present

## 2021-03-19 DIAGNOSIS — Z7984 Long term (current) use of oral hypoglycemic drugs: Secondary | ICD-10-CM | POA: Diagnosis not present

## 2021-03-19 DIAGNOSIS — I10 Essential (primary) hypertension: Secondary | ICD-10-CM | POA: Diagnosis not present

## 2021-03-19 DIAGNOSIS — Z9049 Acquired absence of other specified parts of digestive tract: Secondary | ICD-10-CM | POA: Diagnosis not present

## 2021-03-19 DIAGNOSIS — Z880 Allergy status to penicillin: Secondary | ICD-10-CM | POA: Diagnosis not present

## 2021-03-19 DIAGNOSIS — H538 Other visual disturbances: Secondary | ICD-10-CM | POA: Diagnosis not present

## 2021-03-19 DIAGNOSIS — R0601 Orthopnea: Secondary | ICD-10-CM | POA: Diagnosis not present

## 2021-03-19 DIAGNOSIS — E1142 Type 2 diabetes mellitus with diabetic polyneuropathy: Secondary | ICD-10-CM | POA: Diagnosis not present

## 2021-03-19 DIAGNOSIS — Z90710 Acquired absence of both cervix and uterus: Secondary | ICD-10-CM | POA: Diagnosis not present

## 2021-03-19 DIAGNOSIS — R110 Nausea: Secondary | ICD-10-CM | POA: Diagnosis not present

## 2021-03-19 DIAGNOSIS — H5711 Ocular pain, right eye: Secondary | ICD-10-CM | POA: Diagnosis not present

## 2021-03-19 DIAGNOSIS — J45909 Unspecified asthma, uncomplicated: Secondary | ICD-10-CM | POA: Diagnosis not present

## 2021-03-19 DIAGNOSIS — R079 Chest pain, unspecified: Secondary | ICD-10-CM | POA: Diagnosis not present

## 2021-03-19 DIAGNOSIS — Z885 Allergy status to narcotic agent status: Secondary | ICD-10-CM | POA: Diagnosis not present

## 2021-03-20 DIAGNOSIS — R7989 Other specified abnormal findings of blood chemistry: Secondary | ICD-10-CM | POA: Diagnosis not present

## 2021-03-20 DIAGNOSIS — M797 Fibromyalgia: Secondary | ICD-10-CM | POA: Diagnosis not present

## 2021-03-20 DIAGNOSIS — I10 Essential (primary) hypertension: Secondary | ICD-10-CM | POA: Diagnosis not present

## 2021-03-20 DIAGNOSIS — Z96659 Presence of unspecified artificial knee joint: Secondary | ICD-10-CM | POA: Diagnosis not present

## 2021-03-20 DIAGNOSIS — E1142 Type 2 diabetes mellitus with diabetic polyneuropathy: Secondary | ICD-10-CM | POA: Diagnosis not present

## 2021-03-20 DIAGNOSIS — R0602 Shortness of breath: Secondary | ICD-10-CM | POA: Diagnosis not present

## 2021-03-20 DIAGNOSIS — M25569 Pain in unspecified knee: Secondary | ICD-10-CM | POA: Diagnosis not present

## 2021-03-20 DIAGNOSIS — Z90710 Acquired absence of both cervix and uterus: Secondary | ICD-10-CM | POA: Diagnosis not present

## 2021-03-20 DIAGNOSIS — R0789 Other chest pain: Secondary | ICD-10-CM | POA: Diagnosis not present

## 2021-03-20 DIAGNOSIS — M329 Systemic lupus erythematosus, unspecified: Secondary | ICD-10-CM | POA: Diagnosis not present

## 2021-03-20 DIAGNOSIS — E785 Hyperlipidemia, unspecified: Secondary | ICD-10-CM | POA: Diagnosis not present

## 2021-03-20 DIAGNOSIS — R079 Chest pain, unspecified: Secondary | ICD-10-CM | POA: Diagnosis not present

## 2021-03-20 DIAGNOSIS — Z79899 Other long term (current) drug therapy: Secondary | ICD-10-CM | POA: Diagnosis not present

## 2021-03-20 DIAGNOSIS — Z9049 Acquired absence of other specified parts of digestive tract: Secondary | ICD-10-CM | POA: Diagnosis not present

## 2021-03-20 DIAGNOSIS — R0601 Orthopnea: Secondary | ICD-10-CM | POA: Diagnosis not present

## 2021-03-20 DIAGNOSIS — Z7984 Long term (current) use of oral hypoglycemic drugs: Secondary | ICD-10-CM | POA: Diagnosis not present

## 2021-03-29 DIAGNOSIS — M7918 Myalgia, other site: Secondary | ICD-10-CM | POA: Diagnosis not present

## 2021-03-29 DIAGNOSIS — M5412 Radiculopathy, cervical region: Secondary | ICD-10-CM | POA: Diagnosis not present

## 2021-03-29 DIAGNOSIS — M47816 Spondylosis without myelopathy or radiculopathy, lumbar region: Secondary | ICD-10-CM | POA: Diagnosis not present

## 2021-03-29 DIAGNOSIS — M79622 Pain in left upper arm: Secondary | ICD-10-CM | POA: Diagnosis not present

## 2021-03-29 DIAGNOSIS — M542 Cervicalgia: Secondary | ICD-10-CM | POA: Diagnosis not present

## 2021-03-29 DIAGNOSIS — M2569 Stiffness of other specified joint, not elsewhere classified: Secondary | ICD-10-CM | POA: Diagnosis not present

## 2021-03-30 DIAGNOSIS — E119 Type 2 diabetes mellitus without complications: Secondary | ICD-10-CM | POA: Diagnosis not present

## 2021-03-30 DIAGNOSIS — K219 Gastro-esophageal reflux disease without esophagitis: Secondary | ICD-10-CM | POA: Diagnosis not present

## 2021-03-30 DIAGNOSIS — Z713 Dietary counseling and surveillance: Secondary | ICD-10-CM | POA: Diagnosis not present

## 2021-03-30 DIAGNOSIS — Z6841 Body Mass Index (BMI) 40.0 and over, adult: Secondary | ICD-10-CM | POA: Diagnosis not present

## 2021-03-30 DIAGNOSIS — Z7182 Exercise counseling: Secondary | ICD-10-CM | POA: Diagnosis not present

## 2021-03-30 DIAGNOSIS — I10 Essential (primary) hypertension: Secondary | ICD-10-CM | POA: Diagnosis not present

## 2021-03-30 DIAGNOSIS — K760 Fatty (change of) liver, not elsewhere classified: Secondary | ICD-10-CM | POA: Diagnosis not present

## 2021-03-30 DIAGNOSIS — G4733 Obstructive sleep apnea (adult) (pediatric): Secondary | ICD-10-CM | POA: Diagnosis not present

## 2021-04-01 DIAGNOSIS — R928 Other abnormal and inconclusive findings on diagnostic imaging of breast: Secondary | ICD-10-CM | POA: Diagnosis not present

## 2021-04-01 DIAGNOSIS — Z6841 Body Mass Index (BMI) 40.0 and over, adult: Secondary | ICD-10-CM | POA: Diagnosis not present

## 2021-04-01 DIAGNOSIS — Z86711 Personal history of pulmonary embolism: Secondary | ICD-10-CM | POA: Diagnosis not present

## 2021-04-01 DIAGNOSIS — R1032 Left lower quadrant pain: Secondary | ICD-10-CM | POA: Diagnosis not present

## 2021-04-01 DIAGNOSIS — K5900 Constipation, unspecified: Secondary | ICD-10-CM | POA: Diagnosis not present

## 2021-04-01 DIAGNOSIS — N63 Unspecified lump in unspecified breast: Secondary | ICD-10-CM | POA: Diagnosis not present

## 2021-04-01 DIAGNOSIS — K5792 Diverticulitis of intestine, part unspecified, without perforation or abscess without bleeding: Secondary | ICD-10-CM | POA: Diagnosis not present

## 2021-04-07 DIAGNOSIS — R928 Other abnormal and inconclusive findings on diagnostic imaging of breast: Secondary | ICD-10-CM | POA: Diagnosis not present

## 2021-04-07 DIAGNOSIS — R922 Inconclusive mammogram: Secondary | ICD-10-CM | POA: Diagnosis not present

## 2021-04-07 DIAGNOSIS — N63 Unspecified lump in unspecified breast: Secondary | ICD-10-CM | POA: Diagnosis not present

## 2021-04-09 DIAGNOSIS — E119 Type 2 diabetes mellitus without complications: Secondary | ICD-10-CM | POA: Diagnosis not present

## 2021-04-09 DIAGNOSIS — K760 Fatty (change of) liver, not elsewhere classified: Secondary | ICD-10-CM | POA: Diagnosis not present

## 2021-04-09 DIAGNOSIS — I951 Orthostatic hypotension: Secondary | ICD-10-CM | POA: Diagnosis not present

## 2021-04-09 DIAGNOSIS — Z6841 Body Mass Index (BMI) 40.0 and over, adult: Secondary | ICD-10-CM | POA: Diagnosis not present

## 2021-04-09 DIAGNOSIS — I10 Essential (primary) hypertension: Secondary | ICD-10-CM | POA: Diagnosis not present

## 2021-04-13 DIAGNOSIS — M216X1 Other acquired deformities of right foot: Secondary | ICD-10-CM | POA: Diagnosis not present

## 2021-04-13 DIAGNOSIS — Z86711 Personal history of pulmonary embolism: Secondary | ICD-10-CM | POA: Diagnosis not present

## 2021-04-13 DIAGNOSIS — I10 Essential (primary) hypertension: Secondary | ICD-10-CM | POA: Diagnosis not present

## 2021-04-13 DIAGNOSIS — M329 Systemic lupus erythematosus, unspecified: Secondary | ICD-10-CM | POA: Diagnosis not present

## 2021-04-13 DIAGNOSIS — R768 Other specified abnormal immunological findings in serum: Secondary | ICD-10-CM | POA: Diagnosis not present

## 2021-04-13 DIAGNOSIS — M79642 Pain in left hand: Secondary | ICD-10-CM | POA: Diagnosis not present

## 2021-04-13 DIAGNOSIS — M2550 Pain in unspecified joint: Secondary | ICD-10-CM | POA: Diagnosis not present

## 2021-04-13 DIAGNOSIS — Z885 Allergy status to narcotic agent status: Secondary | ICD-10-CM | POA: Diagnosis not present

## 2021-04-13 DIAGNOSIS — M79641 Pain in right hand: Secondary | ICD-10-CM | POA: Diagnosis not present

## 2021-04-13 DIAGNOSIS — M7731 Calcaneal spur, right foot: Secondary | ICD-10-CM | POA: Diagnosis not present

## 2021-04-13 DIAGNOSIS — M797 Fibromyalgia: Secondary | ICD-10-CM | POA: Diagnosis not present

## 2021-04-13 DIAGNOSIS — M7732 Calcaneal spur, left foot: Secondary | ICD-10-CM | POA: Diagnosis not present

## 2021-04-13 DIAGNOSIS — Z6841 Body Mass Index (BMI) 40.0 and over, adult: Secondary | ICD-10-CM | POA: Diagnosis not present

## 2021-04-13 DIAGNOSIS — Z86718 Personal history of other venous thrombosis and embolism: Secondary | ICD-10-CM | POA: Diagnosis not present

## 2021-04-13 DIAGNOSIS — E785 Hyperlipidemia, unspecified: Secondary | ICD-10-CM | POA: Diagnosis not present

## 2021-04-13 DIAGNOSIS — J45909 Unspecified asthma, uncomplicated: Secondary | ICD-10-CM | POA: Diagnosis not present

## 2021-04-13 DIAGNOSIS — Z7984 Long term (current) use of oral hypoglycemic drugs: Secondary | ICD-10-CM | POA: Diagnosis not present

## 2021-04-13 DIAGNOSIS — Z79899 Other long term (current) drug therapy: Secondary | ICD-10-CM | POA: Diagnosis not present

## 2021-04-13 DIAGNOSIS — G4733 Obstructive sleep apnea (adult) (pediatric): Secondary | ICD-10-CM | POA: Diagnosis not present

## 2021-04-13 DIAGNOSIS — Z880 Allergy status to penicillin: Secondary | ICD-10-CM | POA: Diagnosis not present

## 2021-04-13 DIAGNOSIS — Z888 Allergy status to other drugs, medicaments and biological substances status: Secondary | ICD-10-CM | POA: Diagnosis not present

## 2021-04-13 DIAGNOSIS — K219 Gastro-esophageal reflux disease without esophagitis: Secondary | ICD-10-CM | POA: Diagnosis not present

## 2021-04-13 DIAGNOSIS — E1142 Type 2 diabetes mellitus with diabetic polyneuropathy: Secondary | ICD-10-CM | POA: Diagnosis not present

## 2021-04-13 DIAGNOSIS — R599 Enlarged lymph nodes, unspecified: Secondary | ICD-10-CM | POA: Diagnosis not present

## 2021-04-16 DIAGNOSIS — M25522 Pain in left elbow: Secondary | ICD-10-CM | POA: Diagnosis not present

## 2021-04-16 DIAGNOSIS — Z79899 Other long term (current) drug therapy: Secondary | ICD-10-CM | POA: Diagnosis not present

## 2021-04-16 DIAGNOSIS — K219 Gastro-esophageal reflux disease without esophagitis: Secondary | ICD-10-CM | POA: Diagnosis not present

## 2021-04-16 DIAGNOSIS — K5792 Diverticulitis of intestine, part unspecified, without perforation or abscess without bleeding: Secondary | ICD-10-CM | POA: Diagnosis not present

## 2021-04-16 DIAGNOSIS — M2559 Pain in other specified joint: Secondary | ICD-10-CM | POA: Diagnosis not present

## 2021-04-16 DIAGNOSIS — M7912 Myalgia of auxiliary muscles, head and neck: Secondary | ICD-10-CM | POA: Diagnosis not present

## 2021-04-16 DIAGNOSIS — M79606 Pain in leg, unspecified: Secondary | ICD-10-CM | POA: Diagnosis not present

## 2021-04-16 DIAGNOSIS — M79601 Pain in right arm: Secondary | ICD-10-CM | POA: Diagnosis not present

## 2021-04-16 DIAGNOSIS — Z880 Allergy status to penicillin: Secondary | ICD-10-CM | POA: Diagnosis not present

## 2021-04-16 DIAGNOSIS — M7918 Myalgia, other site: Secondary | ICD-10-CM | POA: Diagnosis not present

## 2021-04-16 DIAGNOSIS — Z885 Allergy status to narcotic agent status: Secondary | ICD-10-CM | POA: Diagnosis not present

## 2021-04-16 DIAGNOSIS — G8929 Other chronic pain: Secondary | ICD-10-CM | POA: Diagnosis not present

## 2021-04-16 DIAGNOSIS — G43709 Chronic migraine without aura, not intractable, without status migrainosus: Secondary | ICD-10-CM | POA: Diagnosis not present

## 2021-04-16 DIAGNOSIS — Z888 Allergy status to other drugs, medicaments and biological substances status: Secondary | ICD-10-CM | POA: Diagnosis not present

## 2021-04-16 DIAGNOSIS — M25521 Pain in right elbow: Secondary | ICD-10-CM | POA: Diagnosis not present

## 2021-04-16 DIAGNOSIS — G629 Polyneuropathy, unspecified: Secondary | ICD-10-CM | POA: Diagnosis not present

## 2021-04-16 DIAGNOSIS — R079 Chest pain, unspecified: Secondary | ICD-10-CM | POA: Diagnosis not present

## 2021-04-16 DIAGNOSIS — M79602 Pain in left arm: Secondary | ICD-10-CM | POA: Diagnosis not present

## 2021-04-16 DIAGNOSIS — M25512 Pain in left shoulder: Secondary | ICD-10-CM | POA: Diagnosis not present

## 2021-04-16 DIAGNOSIS — M47816 Spondylosis without myelopathy or radiculopathy, lumbar region: Secondary | ICD-10-CM | POA: Diagnosis not present

## 2021-04-16 DIAGNOSIS — M792 Neuralgia and neuritis, unspecified: Secondary | ICD-10-CM | POA: Diagnosis not present

## 2021-04-16 DIAGNOSIS — E663 Overweight: Secondary | ICD-10-CM | POA: Diagnosis not present

## 2021-04-16 DIAGNOSIS — M47812 Spondylosis without myelopathy or radiculopathy, cervical region: Secondary | ICD-10-CM | POA: Diagnosis not present

## 2021-04-16 DIAGNOSIS — R410 Disorientation, unspecified: Secondary | ICD-10-CM | POA: Diagnosis not present

## 2021-04-16 DIAGNOSIS — Z6841 Body Mass Index (BMI) 40.0 and over, adult: Secondary | ICD-10-CM | POA: Diagnosis not present

## 2021-04-16 DIAGNOSIS — R251 Tremor, unspecified: Secondary | ICD-10-CM | POA: Diagnosis not present

## 2021-04-16 DIAGNOSIS — G5691 Unspecified mononeuropathy of right upper limb: Secondary | ICD-10-CM | POA: Diagnosis not present

## 2021-04-28 DIAGNOSIS — Z6841 Body Mass Index (BMI) 40.0 and over, adult: Secondary | ICD-10-CM | POA: Diagnosis not present

## 2021-05-04 DIAGNOSIS — M79622 Pain in left upper arm: Secondary | ICD-10-CM | POA: Diagnosis not present

## 2021-05-04 DIAGNOSIS — M5412 Radiculopathy, cervical region: Secondary | ICD-10-CM | POA: Diagnosis not present

## 2021-05-04 DIAGNOSIS — M47816 Spondylosis without myelopathy or radiculopathy, lumbar region: Secondary | ICD-10-CM | POA: Diagnosis not present

## 2021-05-04 DIAGNOSIS — M7918 Myalgia, other site: Secondary | ICD-10-CM | POA: Diagnosis not present

## 2021-05-04 DIAGNOSIS — M2569 Stiffness of other specified joint, not elsewhere classified: Secondary | ICD-10-CM | POA: Diagnosis not present

## 2021-05-04 DIAGNOSIS — M542 Cervicalgia: Secondary | ICD-10-CM | POA: Diagnosis not present

## 2021-05-10 DIAGNOSIS — G43719 Chronic migraine without aura, intractable, without status migrainosus: Secondary | ICD-10-CM | POA: Diagnosis not present

## 2021-05-10 DIAGNOSIS — Z6841 Body Mass Index (BMI) 40.0 and over, adult: Secondary | ICD-10-CM | POA: Diagnosis not present

## 2021-05-13 DIAGNOSIS — R1032 Left lower quadrant pain: Secondary | ICD-10-CM | POA: Diagnosis not present

## 2021-05-13 DIAGNOSIS — M7661 Achilles tendinitis, right leg: Secondary | ICD-10-CM | POA: Diagnosis not present

## 2021-05-13 DIAGNOSIS — M7751 Other enthesopathy of right foot: Secondary | ICD-10-CM | POA: Diagnosis not present

## 2021-05-13 DIAGNOSIS — M2022 Hallux rigidus, left foot: Secondary | ICD-10-CM | POA: Diagnosis not present

## 2021-05-13 DIAGNOSIS — K4020 Bilateral inguinal hernia, without obstruction or gangrene, not specified as recurrent: Secondary | ICD-10-CM | POA: Diagnosis not present

## 2021-05-13 DIAGNOSIS — M2041 Other hammer toe(s) (acquired), right foot: Secondary | ICD-10-CM | POA: Diagnosis not present

## 2021-05-13 DIAGNOSIS — M79672 Pain in left foot: Secondary | ICD-10-CM | POA: Diagnosis not present

## 2021-05-13 DIAGNOSIS — M2042 Other hammer toe(s) (acquired), left foot: Secondary | ICD-10-CM | POA: Diagnosis not present

## 2021-05-13 DIAGNOSIS — Z6841 Body Mass Index (BMI) 40.0 and over, adult: Secondary | ICD-10-CM | POA: Diagnosis not present

## 2021-05-14 DIAGNOSIS — M7582 Other shoulder lesions, left shoulder: Secondary | ICD-10-CM | POA: Diagnosis not present

## 2021-05-14 DIAGNOSIS — M25512 Pain in left shoulder: Secondary | ICD-10-CM | POA: Diagnosis not present

## 2021-05-14 DIAGNOSIS — J329 Chronic sinusitis, unspecified: Secondary | ICD-10-CM | POA: Diagnosis not present

## 2021-05-14 DIAGNOSIS — R222 Localized swelling, mass and lump, trunk: Secondary | ICD-10-CM | POA: Diagnosis not present

## 2021-05-14 DIAGNOSIS — Z6841 Body Mass Index (BMI) 40.0 and over, adult: Secondary | ICD-10-CM | POA: Diagnosis not present

## 2021-05-14 DIAGNOSIS — Z880 Allergy status to penicillin: Secondary | ICD-10-CM | POA: Diagnosis not present

## 2021-05-14 DIAGNOSIS — Z885 Allergy status to narcotic agent status: Secondary | ICD-10-CM | POA: Diagnosis not present

## 2021-05-14 DIAGNOSIS — Z79899 Other long term (current) drug therapy: Secondary | ICD-10-CM | POA: Diagnosis not present

## 2021-05-14 DIAGNOSIS — Z888 Allergy status to other drugs, medicaments and biological substances status: Secondary | ICD-10-CM | POA: Diagnosis not present

## 2021-05-14 DIAGNOSIS — M19012 Primary osteoarthritis, left shoulder: Secondary | ICD-10-CM | POA: Diagnosis not present

## 2021-05-14 DIAGNOSIS — Z791 Long term (current) use of non-steroidal anti-inflammatories (NSAID): Secondary | ICD-10-CM | POA: Diagnosis not present

## 2021-05-14 DIAGNOSIS — M7552 Bursitis of left shoulder: Secondary | ICD-10-CM | POA: Diagnosis not present

## 2021-05-20 DIAGNOSIS — H5021 Vertical strabismus, right eye: Secondary | ICD-10-CM | POA: Diagnosis not present

## 2021-05-20 DIAGNOSIS — H532 Diplopia: Secondary | ICD-10-CM | POA: Diagnosis not present

## 2021-05-25 DIAGNOSIS — Z713 Dietary counseling and surveillance: Secondary | ICD-10-CM | POA: Diagnosis not present

## 2021-05-25 DIAGNOSIS — E119 Type 2 diabetes mellitus without complications: Secondary | ICD-10-CM | POA: Diagnosis not present

## 2021-05-25 DIAGNOSIS — Z6841 Body Mass Index (BMI) 40.0 and over, adult: Secondary | ICD-10-CM | POA: Diagnosis not present

## 2021-05-31 DIAGNOSIS — M5412 Radiculopathy, cervical region: Secondary | ICD-10-CM | POA: Diagnosis not present

## 2021-05-31 DIAGNOSIS — M7918 Myalgia, other site: Secondary | ICD-10-CM | POA: Diagnosis not present

## 2021-05-31 DIAGNOSIS — M47816 Spondylosis without myelopathy or radiculopathy, lumbar region: Secondary | ICD-10-CM | POA: Diagnosis not present

## 2021-05-31 DIAGNOSIS — M542 Cervicalgia: Secondary | ICD-10-CM | POA: Diagnosis not present

## 2021-05-31 DIAGNOSIS — M2569 Stiffness of other specified joint, not elsewhere classified: Secondary | ICD-10-CM | POA: Diagnosis not present

## 2021-05-31 DIAGNOSIS — M79622 Pain in left upper arm: Secondary | ICD-10-CM | POA: Diagnosis not present

## 2021-06-01 DIAGNOSIS — M2022 Hallux rigidus, left foot: Secondary | ICD-10-CM | POA: Diagnosis not present

## 2021-06-15 DIAGNOSIS — K219 Gastro-esophageal reflux disease without esophagitis: Secondary | ICD-10-CM | POA: Diagnosis not present

## 2021-06-15 DIAGNOSIS — E785 Hyperlipidemia, unspecified: Secondary | ICD-10-CM | POA: Diagnosis not present

## 2021-06-15 DIAGNOSIS — Z86711 Personal history of pulmonary embolism: Secondary | ICD-10-CM | POA: Diagnosis not present

## 2021-06-15 DIAGNOSIS — E119 Type 2 diabetes mellitus without complications: Secondary | ICD-10-CM | POA: Diagnosis not present

## 2021-06-15 DIAGNOSIS — M2022 Hallux rigidus, left foot: Secondary | ICD-10-CM | POA: Diagnosis not present

## 2021-06-15 DIAGNOSIS — D1801 Hemangioma of skin and subcutaneous tissue: Secondary | ICD-10-CM | POA: Diagnosis not present

## 2021-06-15 DIAGNOSIS — D171 Benign lipomatous neoplasm of skin and subcutaneous tissue of trunk: Secondary | ICD-10-CM | POA: Diagnosis not present

## 2021-06-15 DIAGNOSIS — M2420 Disorder of ligament, unspecified site: Secondary | ICD-10-CM | POA: Diagnosis not present

## 2021-06-15 DIAGNOSIS — G4733 Obstructive sleep apnea (adult) (pediatric): Secondary | ICD-10-CM | POA: Diagnosis not present

## 2021-06-15 DIAGNOSIS — Z1283 Encounter for screening for malignant neoplasm of skin: Secondary | ICD-10-CM | POA: Diagnosis not present

## 2021-06-15 DIAGNOSIS — L814 Other melanin hyperpigmentation: Secondary | ICD-10-CM | POA: Diagnosis not present

## 2021-06-15 DIAGNOSIS — I10 Essential (primary) hypertension: Secondary | ICD-10-CM | POA: Diagnosis not present

## 2021-06-15 DIAGNOSIS — L219 Seborrheic dermatitis, unspecified: Secondary | ICD-10-CM | POA: Diagnosis not present

## 2021-06-15 DIAGNOSIS — G43709 Chronic migraine without aura, not intractable, without status migrainosus: Secondary | ICD-10-CM | POA: Diagnosis not present

## 2021-06-21 DIAGNOSIS — Z86711 Personal history of pulmonary embolism: Secondary | ICD-10-CM | POA: Diagnosis not present

## 2021-06-21 DIAGNOSIS — I10 Essential (primary) hypertension: Secondary | ICD-10-CM | POA: Diagnosis not present

## 2021-06-21 DIAGNOSIS — R946 Abnormal results of thyroid function studies: Secondary | ICD-10-CM | POA: Diagnosis not present

## 2021-06-21 DIAGNOSIS — R222 Localized swelling, mass and lump, trunk: Secondary | ICD-10-CM | POA: Diagnosis not present

## 2021-06-21 DIAGNOSIS — E785 Hyperlipidemia, unspecified: Secondary | ICD-10-CM | POA: Diagnosis not present

## 2021-06-21 DIAGNOSIS — E119 Type 2 diabetes mellitus without complications: Secondary | ICD-10-CM | POA: Diagnosis not present

## 2021-06-21 DIAGNOSIS — Z6841 Body Mass Index (BMI) 40.0 and over, adult: Secondary | ICD-10-CM | POA: Diagnosis not present

## 2021-06-21 DIAGNOSIS — K760 Fatty (change of) liver, not elsewhere classified: Secondary | ICD-10-CM | POA: Diagnosis not present

## 2021-06-21 DIAGNOSIS — Z0000 Encounter for general adult medical examination without abnormal findings: Secondary | ICD-10-CM | POA: Diagnosis not present

## 2021-06-22 DIAGNOSIS — K760 Fatty (change of) liver, not elsewhere classified: Secondary | ICD-10-CM | POA: Diagnosis not present

## 2021-06-22 DIAGNOSIS — E785 Hyperlipidemia, unspecified: Secondary | ICD-10-CM | POA: Diagnosis not present

## 2021-06-22 DIAGNOSIS — E119 Type 2 diabetes mellitus without complications: Secondary | ICD-10-CM | POA: Diagnosis not present

## 2021-06-22 DIAGNOSIS — I10 Essential (primary) hypertension: Secondary | ICD-10-CM | POA: Diagnosis not present

## 2021-06-22 DIAGNOSIS — R946 Abnormal results of thyroid function studies: Secondary | ICD-10-CM | POA: Diagnosis not present

## 2021-06-28 DIAGNOSIS — Z6841 Body Mass Index (BMI) 40.0 and over, adult: Secondary | ICD-10-CM | POA: Diagnosis not present

## 2021-06-29 DIAGNOSIS — M7918 Myalgia, other site: Secondary | ICD-10-CM | POA: Diagnosis not present

## 2021-06-29 DIAGNOSIS — I10 Essential (primary) hypertension: Secondary | ICD-10-CM | POA: Diagnosis not present

## 2021-06-29 DIAGNOSIS — G4733 Obstructive sleep apnea (adult) (pediatric): Secondary | ICD-10-CM | POA: Diagnosis not present

## 2021-06-29 DIAGNOSIS — Z6841 Body Mass Index (BMI) 40.0 and over, adult: Secondary | ICD-10-CM | POA: Diagnosis not present

## 2021-06-29 DIAGNOSIS — G901 Familial dysautonomia [Riley-Day]: Secondary | ICD-10-CM | POA: Diagnosis not present

## 2021-06-29 DIAGNOSIS — M2569 Stiffness of other specified joint, not elsewhere classified: Secondary | ICD-10-CM | POA: Diagnosis not present

## 2021-06-29 DIAGNOSIS — M47816 Spondylosis without myelopathy or radiculopathy, lumbar region: Secondary | ICD-10-CM | POA: Diagnosis not present

## 2021-06-29 DIAGNOSIS — M79622 Pain in left upper arm: Secondary | ICD-10-CM | POA: Diagnosis not present

## 2021-06-29 DIAGNOSIS — E1169 Type 2 diabetes mellitus with other specified complication: Secondary | ICD-10-CM | POA: Diagnosis not present

## 2021-06-29 DIAGNOSIS — M542 Cervicalgia: Secondary | ICD-10-CM | POA: Diagnosis not present

## 2021-06-29 DIAGNOSIS — M5412 Radiculopathy, cervical region: Secondary | ICD-10-CM | POA: Diagnosis not present

## 2021-06-29 DIAGNOSIS — E785 Hyperlipidemia, unspecified: Secondary | ICD-10-CM | POA: Diagnosis not present

## 2021-06-29 DIAGNOSIS — I951 Orthostatic hypotension: Secondary | ICD-10-CM | POA: Diagnosis not present

## 2021-06-29 DIAGNOSIS — R030 Elevated blood-pressure reading, without diagnosis of hypertension: Secondary | ICD-10-CM | POA: Diagnosis not present

## 2021-06-30 DIAGNOSIS — G894 Chronic pain syndrome: Secondary | ICD-10-CM | POA: Diagnosis not present

## 2021-06-30 DIAGNOSIS — M2041 Other hammer toe(s) (acquired), right foot: Secondary | ICD-10-CM | POA: Diagnosis not present

## 2021-07-02 DIAGNOSIS — E1142 Type 2 diabetes mellitus with diabetic polyneuropathy: Secondary | ICD-10-CM | POA: Diagnosis not present

## 2021-07-02 DIAGNOSIS — Z7984 Long term (current) use of oral hypoglycemic drugs: Secondary | ICD-10-CM | POA: Diagnosis not present

## 2021-07-02 DIAGNOSIS — M2022 Hallux rigidus, left foot: Secondary | ICD-10-CM | POA: Diagnosis not present

## 2021-07-02 DIAGNOSIS — Z885 Allergy status to narcotic agent status: Secondary | ICD-10-CM | POA: Diagnosis not present

## 2021-07-02 DIAGNOSIS — Z86711 Personal history of pulmonary embolism: Secondary | ICD-10-CM | POA: Diagnosis not present

## 2021-07-02 DIAGNOSIS — L932 Other local lupus erythematosus: Secondary | ICD-10-CM | POA: Diagnosis not present

## 2021-07-02 DIAGNOSIS — Z6841 Body Mass Index (BMI) 40.0 and over, adult: Secondary | ICD-10-CM | POA: Diagnosis not present

## 2021-07-02 DIAGNOSIS — M797 Fibromyalgia: Secondary | ICD-10-CM | POA: Diagnosis not present

## 2021-07-02 DIAGNOSIS — I10 Essential (primary) hypertension: Secondary | ICD-10-CM | POA: Diagnosis not present

## 2021-07-02 DIAGNOSIS — G8918 Other acute postprocedural pain: Secondary | ICD-10-CM | POA: Diagnosis not present

## 2021-07-02 DIAGNOSIS — Z880 Allergy status to penicillin: Secondary | ICD-10-CM | POA: Diagnosis not present

## 2021-07-02 DIAGNOSIS — R000 Tachycardia, unspecified: Secondary | ICD-10-CM | POA: Diagnosis not present

## 2021-07-02 DIAGNOSIS — K219 Gastro-esophageal reflux disease without esophagitis: Secondary | ICD-10-CM | POA: Diagnosis not present

## 2021-07-02 DIAGNOSIS — K760 Fatty (change of) liver, not elsewhere classified: Secondary | ICD-10-CM | POA: Diagnosis not present

## 2021-07-02 DIAGNOSIS — Z86718 Personal history of other venous thrombosis and embolism: Secondary | ICD-10-CM | POA: Diagnosis not present

## 2021-07-02 DIAGNOSIS — G43909 Migraine, unspecified, not intractable, without status migrainosus: Secondary | ICD-10-CM | POA: Diagnosis not present

## 2021-07-02 DIAGNOSIS — E785 Hyperlipidemia, unspecified: Secondary | ICD-10-CM | POA: Diagnosis not present

## 2021-07-02 DIAGNOSIS — Z79899 Other long term (current) drug therapy: Secondary | ICD-10-CM | POA: Diagnosis not present

## 2021-07-02 DIAGNOSIS — G4733 Obstructive sleep apnea (adult) (pediatric): Secondary | ICD-10-CM | POA: Diagnosis not present

## 2021-07-02 DIAGNOSIS — H409 Unspecified glaucoma: Secondary | ICD-10-CM | POA: Diagnosis not present

## 2021-07-02 DIAGNOSIS — M2042 Other hammer toe(s) (acquired), left foot: Secondary | ICD-10-CM | POA: Diagnosis not present

## 2021-07-03 DIAGNOSIS — G8918 Other acute postprocedural pain: Secondary | ICD-10-CM | POA: Diagnosis not present

## 2021-07-03 DIAGNOSIS — M2022 Hallux rigidus, left foot: Secondary | ICD-10-CM | POA: Diagnosis not present

## 2021-07-27 DIAGNOSIS — E669 Obesity, unspecified: Secondary | ICD-10-CM | POA: Diagnosis not present

## 2021-07-27 DIAGNOSIS — Z6841 Body Mass Index (BMI) 40.0 and over, adult: Secondary | ICD-10-CM | POA: Diagnosis not present

## 2021-07-27 DIAGNOSIS — Z79899 Other long term (current) drug therapy: Secondary | ICD-10-CM | POA: Diagnosis not present

## 2021-07-27 DIAGNOSIS — E119 Type 2 diabetes mellitus without complications: Secondary | ICD-10-CM | POA: Diagnosis not present

## 2021-07-27 DIAGNOSIS — I10 Essential (primary) hypertension: Secondary | ICD-10-CM | POA: Diagnosis not present

## 2021-07-31 DIAGNOSIS — R110 Nausea: Secondary | ICD-10-CM | POA: Diagnosis not present

## 2021-07-31 DIAGNOSIS — M542 Cervicalgia: Secondary | ICD-10-CM | POA: Diagnosis not present

## 2021-07-31 DIAGNOSIS — Z885 Allergy status to narcotic agent status: Secondary | ICD-10-CM | POA: Diagnosis not present

## 2021-07-31 DIAGNOSIS — R200 Anesthesia of skin: Secondary | ICD-10-CM | POA: Diagnosis not present

## 2021-07-31 DIAGNOSIS — M25512 Pain in left shoulder: Secondary | ICD-10-CM | POA: Diagnosis not present

## 2021-07-31 DIAGNOSIS — E1142 Type 2 diabetes mellitus with diabetic polyneuropathy: Secondary | ICD-10-CM | POA: Diagnosis not present

## 2021-07-31 DIAGNOSIS — R519 Headache, unspecified: Secondary | ICD-10-CM | POA: Diagnosis not present

## 2021-07-31 DIAGNOSIS — E785 Hyperlipidemia, unspecified: Secondary | ICD-10-CM | POA: Diagnosis not present

## 2021-07-31 DIAGNOSIS — R079 Chest pain, unspecified: Secondary | ICD-10-CM | POA: Diagnosis not present

## 2021-07-31 DIAGNOSIS — Z880 Allergy status to penicillin: Secondary | ICD-10-CM | POA: Diagnosis not present

## 2021-07-31 DIAGNOSIS — R42 Dizziness and giddiness: Secondary | ICD-10-CM | POA: Diagnosis not present

## 2021-07-31 DIAGNOSIS — R0602 Shortness of breath: Secondary | ICD-10-CM | POA: Diagnosis not present

## 2021-07-31 DIAGNOSIS — R0789 Other chest pain: Secondary | ICD-10-CM | POA: Diagnosis not present

## 2021-07-31 DIAGNOSIS — I10 Essential (primary) hypertension: Secondary | ICD-10-CM | POA: Diagnosis not present

## 2021-08-01 DIAGNOSIS — I10 Essential (primary) hypertension: Secondary | ICD-10-CM | POA: Diagnosis not present

## 2021-08-01 DIAGNOSIS — R079 Chest pain, unspecified: Secondary | ICD-10-CM | POA: Diagnosis not present

## 2021-08-03 DIAGNOSIS — M542 Cervicalgia: Secondary | ICD-10-CM | POA: Diagnosis not present

## 2021-08-03 DIAGNOSIS — M7918 Myalgia, other site: Secondary | ICD-10-CM | POA: Diagnosis not present

## 2021-08-03 DIAGNOSIS — M47816 Spondylosis without myelopathy or radiculopathy, lumbar region: Secondary | ICD-10-CM | POA: Diagnosis not present

## 2021-08-03 DIAGNOSIS — M2569 Stiffness of other specified joint, not elsewhere classified: Secondary | ICD-10-CM | POA: Diagnosis not present

## 2021-08-03 DIAGNOSIS — M79622 Pain in left upper arm: Secondary | ICD-10-CM | POA: Diagnosis not present

## 2021-08-03 DIAGNOSIS — M5412 Radiculopathy, cervical region: Secondary | ICD-10-CM | POA: Diagnosis not present

## 2021-08-03 DIAGNOSIS — M19072 Primary osteoarthritis, left ankle and foot: Secondary | ICD-10-CM | POA: Diagnosis not present

## 2021-08-05 DIAGNOSIS — M2420 Disorder of ligament, unspecified site: Secondary | ICD-10-CM | POA: Diagnosis not present

## 2021-08-05 DIAGNOSIS — R1310 Dysphagia, unspecified: Secondary | ICD-10-CM | POA: Diagnosis not present

## 2021-08-09 DIAGNOSIS — H402230 Chronic angle-closure glaucoma, bilateral, stage unspecified: Secondary | ICD-10-CM | POA: Diagnosis not present

## 2021-08-09 DIAGNOSIS — E119 Type 2 diabetes mellitus without complications: Secondary | ICD-10-CM | POA: Diagnosis not present

## 2021-08-09 DIAGNOSIS — H4911 Fourth [trochlear] nerve palsy, right eye: Secondary | ICD-10-CM | POA: Diagnosis not present

## 2021-08-09 DIAGNOSIS — H35363 Drusen (degenerative) of macula, bilateral: Secondary | ICD-10-CM | POA: Diagnosis not present

## 2021-08-09 DIAGNOSIS — H524 Presbyopia: Secondary | ICD-10-CM | POA: Diagnosis not present

## 2021-08-09 DIAGNOSIS — H52203 Unspecified astigmatism, bilateral: Secondary | ICD-10-CM | POA: Diagnosis not present

## 2021-08-10 DIAGNOSIS — R1310 Dysphagia, unspecified: Secondary | ICD-10-CM | POA: Diagnosis not present

## 2021-08-10 DIAGNOSIS — T17328A Food in larynx causing other injury, initial encounter: Secondary | ICD-10-CM | POA: Diagnosis not present

## 2021-08-10 DIAGNOSIS — E041 Nontoxic single thyroid nodule: Secondary | ICD-10-CM | POA: Diagnosis not present

## 2021-08-10 DIAGNOSIS — M2420 Disorder of ligament, unspecified site: Secondary | ICD-10-CM | POA: Diagnosis not present

## 2021-08-10 DIAGNOSIS — E079 Disorder of thyroid, unspecified: Secondary | ICD-10-CM | POA: Diagnosis not present

## 2021-08-13 DIAGNOSIS — G901 Familial dysautonomia [Riley-Day]: Secondary | ICD-10-CM | POA: Diagnosis not present

## 2021-08-13 DIAGNOSIS — I10 Essential (primary) hypertension: Secondary | ICD-10-CM | POA: Diagnosis not present

## 2021-08-13 DIAGNOSIS — Z6841 Body Mass Index (BMI) 40.0 and over, adult: Secondary | ICD-10-CM | POA: Diagnosis not present

## 2021-08-13 DIAGNOSIS — G4733 Obstructive sleep apnea (adult) (pediatric): Secondary | ICD-10-CM | POA: Diagnosis not present

## 2021-08-16 DIAGNOSIS — H5021 Vertical strabismus, right eye: Secondary | ICD-10-CM | POA: Diagnosis not present

## 2021-08-16 DIAGNOSIS — H532 Diplopia: Secondary | ICD-10-CM | POA: Diagnosis not present

## 2021-08-17 DIAGNOSIS — M7711 Lateral epicondylitis, right elbow: Secondary | ICD-10-CM | POA: Diagnosis not present

## 2021-08-17 DIAGNOSIS — M7582 Other shoulder lesions, left shoulder: Secondary | ICD-10-CM | POA: Diagnosis not present

## 2021-08-17 DIAGNOSIS — M7552 Bursitis of left shoulder: Secondary | ICD-10-CM | POA: Diagnosis not present

## 2021-08-17 DIAGNOSIS — M7712 Lateral epicondylitis, left elbow: Secondary | ICD-10-CM | POA: Diagnosis not present

## 2021-08-25 DIAGNOSIS — E785 Hyperlipidemia, unspecified: Secondary | ICD-10-CM | POA: Diagnosis not present

## 2021-08-25 DIAGNOSIS — I10 Essential (primary) hypertension: Secondary | ICD-10-CM | POA: Diagnosis not present

## 2021-08-25 DIAGNOSIS — R55 Syncope and collapse: Secondary | ICD-10-CM | POA: Diagnosis not present

## 2021-08-25 DIAGNOSIS — G4733 Obstructive sleep apnea (adult) (pediatric): Secondary | ICD-10-CM | POA: Diagnosis not present

## 2021-08-25 DIAGNOSIS — I951 Orthostatic hypotension: Secondary | ICD-10-CM | POA: Diagnosis not present

## 2021-08-25 DIAGNOSIS — R030 Elevated blood-pressure reading, without diagnosis of hypertension: Secondary | ICD-10-CM | POA: Diagnosis not present

## 2021-08-25 DIAGNOSIS — G901 Familial dysautonomia [Riley-Day]: Secondary | ICD-10-CM | POA: Diagnosis not present

## 2021-08-25 DIAGNOSIS — R42 Dizziness and giddiness: Secondary | ICD-10-CM | POA: Diagnosis not present

## 2021-08-25 DIAGNOSIS — E1169 Type 2 diabetes mellitus with other specified complication: Secondary | ICD-10-CM | POA: Diagnosis not present

## 2021-08-30 DIAGNOSIS — Z9989 Dependence on other enabling machines and devices: Secondary | ICD-10-CM | POA: Diagnosis not present

## 2021-08-30 DIAGNOSIS — E1142 Type 2 diabetes mellitus with diabetic polyneuropathy: Secondary | ICD-10-CM | POA: Diagnosis not present

## 2021-08-30 DIAGNOSIS — Z880 Allergy status to penicillin: Secondary | ICD-10-CM | POA: Diagnosis not present

## 2021-08-30 DIAGNOSIS — Z6841 Body Mass Index (BMI) 40.0 and over, adult: Secondary | ICD-10-CM | POA: Diagnosis not present

## 2021-08-30 DIAGNOSIS — E785 Hyperlipidemia, unspecified: Secondary | ICD-10-CM | POA: Diagnosis not present

## 2021-08-30 DIAGNOSIS — Z8719 Personal history of other diseases of the digestive system: Secondary | ICD-10-CM | POA: Diagnosis not present

## 2021-08-30 DIAGNOSIS — I10 Essential (primary) hypertension: Secondary | ICD-10-CM | POA: Diagnosis not present

## 2021-08-30 DIAGNOSIS — K7400 Hepatic fibrosis, unspecified: Secondary | ICD-10-CM | POA: Diagnosis not present

## 2021-08-30 DIAGNOSIS — E119 Type 2 diabetes mellitus without complications: Secondary | ICD-10-CM | POA: Diagnosis not present

## 2021-08-30 DIAGNOSIS — Z0389 Encounter for observation for other suspected diseases and conditions ruled out: Secondary | ICD-10-CM | POA: Diagnosis not present

## 2021-08-30 DIAGNOSIS — G4733 Obstructive sleep apnea (adult) (pediatric): Secondary | ICD-10-CM | POA: Diagnosis not present

## 2021-08-30 DIAGNOSIS — Z885 Allergy status to narcotic agent status: Secondary | ICD-10-CM | POA: Diagnosis not present

## 2021-08-30 DIAGNOSIS — K760 Fatty (change of) liver, not elsewhere classified: Secondary | ICD-10-CM | POA: Diagnosis not present

## 2021-08-30 DIAGNOSIS — Z79899 Other long term (current) drug therapy: Secondary | ICD-10-CM | POA: Diagnosis not present

## 2021-08-30 DIAGNOSIS — M797 Fibromyalgia: Secondary | ICD-10-CM | POA: Diagnosis not present

## 2021-08-31 DIAGNOSIS — G4733 Obstructive sleep apnea (adult) (pediatric): Secondary | ICD-10-CM | POA: Diagnosis not present

## 2021-08-31 DIAGNOSIS — R400 Somnolence: Secondary | ICD-10-CM | POA: Diagnosis not present

## 2021-08-31 DIAGNOSIS — Z9989 Dependence on other enabling machines and devices: Secondary | ICD-10-CM | POA: Diagnosis not present

## 2021-09-03 DIAGNOSIS — M170 Bilateral primary osteoarthritis of knee: Secondary | ICD-10-CM | POA: Diagnosis not present

## 2021-09-03 DIAGNOSIS — I10 Essential (primary) hypertension: Secondary | ICD-10-CM | POA: Diagnosis not present

## 2021-09-03 DIAGNOSIS — Z6841 Body Mass Index (BMI) 40.0 and over, adult: Secondary | ICD-10-CM | POA: Diagnosis not present

## 2021-09-03 DIAGNOSIS — E785 Hyperlipidemia, unspecified: Secondary | ICD-10-CM | POA: Diagnosis not present

## 2021-09-03 DIAGNOSIS — Q794 Prune belly syndrome: Secondary | ICD-10-CM | POA: Diagnosis not present

## 2021-09-03 DIAGNOSIS — E119 Type 2 diabetes mellitus without complications: Secondary | ICD-10-CM | POA: Diagnosis not present

## 2021-09-03 DIAGNOSIS — Z713 Dietary counseling and surveillance: Secondary | ICD-10-CM | POA: Diagnosis not present

## 2021-09-04 DIAGNOSIS — J329 Chronic sinusitis, unspecified: Secondary | ICD-10-CM | POA: Diagnosis not present

## 2021-09-08 DIAGNOSIS — M7918 Myalgia, other site: Secondary | ICD-10-CM | POA: Diagnosis not present

## 2021-09-08 DIAGNOSIS — M1990 Unspecified osteoarthritis, unspecified site: Secondary | ICD-10-CM | POA: Diagnosis not present

## 2021-09-08 DIAGNOSIS — M792 Neuralgia and neuritis, unspecified: Secondary | ICD-10-CM | POA: Diagnosis not present

## 2021-09-08 DIAGNOSIS — Z6841 Body Mass Index (BMI) 40.0 and over, adult: Secondary | ICD-10-CM | POA: Diagnosis not present

## 2021-09-08 DIAGNOSIS — M47816 Spondylosis without myelopathy or radiculopathy, lumbar region: Secondary | ICD-10-CM | POA: Diagnosis not present

## 2021-09-13 DIAGNOSIS — F54 Psychological and behavioral factors associated with disorders or diseases classified elsewhere: Secondary | ICD-10-CM | POA: Diagnosis not present

## 2021-09-15 DIAGNOSIS — E1142 Type 2 diabetes mellitus with diabetic polyneuropathy: Secondary | ICD-10-CM | POA: Diagnosis not present

## 2021-09-15 DIAGNOSIS — K219 Gastro-esophageal reflux disease without esophagitis: Secondary | ICD-10-CM | POA: Diagnosis not present

## 2021-09-15 DIAGNOSIS — Z6841 Body Mass Index (BMI) 40.0 and over, adult: Secondary | ICD-10-CM | POA: Diagnosis not present

## 2021-09-15 DIAGNOSIS — M329 Systemic lupus erythematosus, unspecified: Secondary | ICD-10-CM | POA: Diagnosis not present

## 2021-09-15 DIAGNOSIS — Z89512 Acquired absence of left leg below knee: Secondary | ICD-10-CM | POA: Diagnosis not present

## 2021-09-15 DIAGNOSIS — G8929 Other chronic pain: Secondary | ICD-10-CM | POA: Diagnosis not present

## 2021-09-15 DIAGNOSIS — Z8719 Personal history of other diseases of the digestive system: Secondary | ICD-10-CM | POA: Diagnosis not present

## 2021-09-15 DIAGNOSIS — Z96653 Presence of artificial knee joint, bilateral: Secondary | ICD-10-CM | POA: Diagnosis not present

## 2021-09-15 DIAGNOSIS — Z86718 Personal history of other venous thrombosis and embolism: Secondary | ICD-10-CM | POA: Diagnosis not present

## 2021-09-15 DIAGNOSIS — K2950 Unspecified chronic gastritis without bleeding: Secondary | ICD-10-CM | POA: Diagnosis not present

## 2021-09-15 DIAGNOSIS — Z801 Family history of malignant neoplasm of trachea, bronchus and lung: Secondary | ICD-10-CM | POA: Diagnosis not present

## 2021-09-15 DIAGNOSIS — R42 Dizziness and giddiness: Secondary | ICD-10-CM | POA: Diagnosis not present

## 2021-09-15 DIAGNOSIS — M170 Bilateral primary osteoarthritis of knee: Secondary | ICD-10-CM | POA: Diagnosis not present

## 2021-09-15 DIAGNOSIS — R079 Chest pain, unspecified: Secondary | ICD-10-CM | POA: Diagnosis not present

## 2021-09-15 DIAGNOSIS — Z89511 Acquired absence of right leg below knee: Secondary | ICD-10-CM | POA: Diagnosis not present

## 2021-09-15 DIAGNOSIS — Z8041 Family history of malignant neoplasm of ovary: Secondary | ICD-10-CM | POA: Diagnosis not present

## 2021-09-15 DIAGNOSIS — I10 Essential (primary) hypertension: Secondary | ICD-10-CM | POA: Diagnosis not present

## 2021-09-15 DIAGNOSIS — G4733 Obstructive sleep apnea (adult) (pediatric): Secondary | ICD-10-CM | POA: Diagnosis not present

## 2021-09-15 DIAGNOSIS — E785 Hyperlipidemia, unspecified: Secondary | ICD-10-CM | POA: Diagnosis not present

## 2021-09-15 DIAGNOSIS — M797 Fibromyalgia: Secondary | ICD-10-CM | POA: Diagnosis not present

## 2021-09-15 DIAGNOSIS — E0520 Thyrotoxicosis with toxic multinodular goiter without thyrotoxic crisis or storm: Secondary | ICD-10-CM | POA: Diagnosis not present

## 2021-09-15 DIAGNOSIS — K2970 Gastritis, unspecified, without bleeding: Secondary | ICD-10-CM | POA: Diagnosis not present

## 2021-09-15 DIAGNOSIS — R0789 Other chest pain: Secondary | ICD-10-CM | POA: Diagnosis not present

## 2021-09-15 DIAGNOSIS — K317 Polyp of stomach and duodenum: Secondary | ICD-10-CM | POA: Diagnosis not present

## 2021-09-15 DIAGNOSIS — K760 Fatty (change of) liver, not elsewhere classified: Secondary | ICD-10-CM | POA: Diagnosis not present

## 2021-09-15 DIAGNOSIS — R9431 Abnormal electrocardiogram [ECG] [EKG]: Secondary | ICD-10-CM | POA: Diagnosis not present

## 2021-09-16 DIAGNOSIS — R0789 Other chest pain: Secondary | ICD-10-CM | POA: Diagnosis not present

## 2021-09-16 DIAGNOSIS — R9431 Abnormal electrocardiogram [ECG] [EKG]: Secondary | ICD-10-CM | POA: Diagnosis not present

## 2021-09-24 DIAGNOSIS — Z713 Dietary counseling and surveillance: Secondary | ICD-10-CM | POA: Diagnosis not present

## 2021-09-24 DIAGNOSIS — I10 Essential (primary) hypertension: Secondary | ICD-10-CM | POA: Diagnosis not present

## 2021-09-24 DIAGNOSIS — E119 Type 2 diabetes mellitus without complications: Secondary | ICD-10-CM | POA: Diagnosis not present

## 2021-09-24 DIAGNOSIS — Z6841 Body Mass Index (BMI) 40.0 and over, adult: Secondary | ICD-10-CM | POA: Diagnosis not present

## 2021-09-27 DIAGNOSIS — Z09 Encounter for follow-up examination after completed treatment for conditions other than malignant neoplasm: Secondary | ICD-10-CM | POA: Diagnosis not present

## 2021-09-27 DIAGNOSIS — M2022 Hallux rigidus, left foot: Secondary | ICD-10-CM | POA: Diagnosis not present

## 2021-09-27 DIAGNOSIS — G44229 Chronic tension-type headache, not intractable: Secondary | ICD-10-CM | POA: Diagnosis not present

## 2021-09-27 DIAGNOSIS — M7918 Myalgia, other site: Secondary | ICD-10-CM | POA: Diagnosis not present

## 2021-09-27 DIAGNOSIS — G43719 Chronic migraine without aura, intractable, without status migrainosus: Secondary | ICD-10-CM | POA: Diagnosis not present

## 2021-09-27 DIAGNOSIS — Z6841 Body Mass Index (BMI) 40.0 and over, adult: Secondary | ICD-10-CM | POA: Diagnosis not present

## 2021-10-01 DIAGNOSIS — M2022 Hallux rigidus, left foot: Secondary | ICD-10-CM | POA: Diagnosis not present

## 2021-10-01 DIAGNOSIS — E119 Type 2 diabetes mellitus without complications: Secondary | ICD-10-CM | POA: Diagnosis not present

## 2021-10-01 DIAGNOSIS — G4733 Obstructive sleep apnea (adult) (pediatric): Secondary | ICD-10-CM | POA: Diagnosis not present

## 2021-10-01 DIAGNOSIS — K760 Fatty (change of) liver, not elsewhere classified: Secondary | ICD-10-CM | POA: Diagnosis not present

## 2021-10-01 DIAGNOSIS — Z6841 Body Mass Index (BMI) 40.0 and over, adult: Secondary | ICD-10-CM | POA: Diagnosis not present

## 2021-10-06 DIAGNOSIS — M79672 Pain in left foot: Secondary | ICD-10-CM | POA: Diagnosis not present

## 2021-10-06 DIAGNOSIS — M109 Gout, unspecified: Secondary | ICD-10-CM | POA: Diagnosis not present

## 2021-10-06 DIAGNOSIS — Z6841 Body Mass Index (BMI) 40.0 and over, adult: Secondary | ICD-10-CM | POA: Diagnosis not present

## 2021-10-06 DIAGNOSIS — Z23 Encounter for immunization: Secondary | ICD-10-CM | POA: Diagnosis not present

## 2021-10-14 DIAGNOSIS — M7712 Lateral epicondylitis, left elbow: Secondary | ICD-10-CM | POA: Diagnosis not present

## 2021-10-15 DIAGNOSIS — G4733 Obstructive sleep apnea (adult) (pediatric): Secondary | ICD-10-CM | POA: Diagnosis not present

## 2021-10-15 DIAGNOSIS — E119 Type 2 diabetes mellitus without complications: Secondary | ICD-10-CM | POA: Diagnosis not present

## 2021-10-15 DIAGNOSIS — Z6841 Body Mass Index (BMI) 40.0 and over, adult: Secondary | ICD-10-CM | POA: Diagnosis not present

## 2021-10-15 DIAGNOSIS — K760 Fatty (change of) liver, not elsewhere classified: Secondary | ICD-10-CM | POA: Diagnosis not present

## 2021-10-15 DIAGNOSIS — Z9989 Dependence on other enabling machines and devices: Secondary | ICD-10-CM | POA: Diagnosis not present

## 2021-10-28 DIAGNOSIS — I951 Orthostatic hypotension: Secondary | ICD-10-CM | POA: Diagnosis not present

## 2021-10-28 DIAGNOSIS — Z79899 Other long term (current) drug therapy: Secondary | ICD-10-CM | POA: Diagnosis not present

## 2021-10-28 DIAGNOSIS — I10 Essential (primary) hypertension: Secondary | ICD-10-CM | POA: Diagnosis not present

## 2021-10-28 DIAGNOSIS — E038 Other specified hypothyroidism: Secondary | ICD-10-CM | POA: Diagnosis not present

## 2021-11-01 DIAGNOSIS — E119 Type 2 diabetes mellitus without complications: Secondary | ICD-10-CM | POA: Diagnosis not present

## 2021-11-01 DIAGNOSIS — G901 Familial dysautonomia [Riley-Day]: Secondary | ICD-10-CM | POA: Diagnosis not present

## 2021-11-01 DIAGNOSIS — G4733 Obstructive sleep apnea (adult) (pediatric): Secondary | ICD-10-CM | POA: Diagnosis not present
